# Patient Record
Sex: Female | Born: 1990 | Race: White | Hispanic: No | Marital: Single | Smoking: Never smoker
Health system: Southern US, Community
[De-identification: ages and names within clinical notes are randomized; demographics above are authoritative.]

## PROBLEM LIST (undated history)

## (undated) ENCOUNTER — Inpatient Hospital Stay (HOSPITAL_COMMUNITY): Payer: Self-pay

## (undated) DIAGNOSIS — E7212 Methylenetetrahydrofolate reductase deficiency: Secondary | ICD-10-CM

## (undated) DIAGNOSIS — M26609 Unspecified temporomandibular joint disorder, unspecified side: Secondary | ICD-10-CM

## (undated) DIAGNOSIS — Z8619 Personal history of other infectious and parasitic diseases: Secondary | ICD-10-CM

## (undated) DIAGNOSIS — E039 Hypothyroidism, unspecified: Secondary | ICD-10-CM

## (undated) DIAGNOSIS — Z973 Presence of spectacles and contact lenses: Secondary | ICD-10-CM

## (undated) DIAGNOSIS — K219 Gastro-esophageal reflux disease without esophagitis: Secondary | ICD-10-CM

## (undated) DIAGNOSIS — R519 Headache, unspecified: Secondary | ICD-10-CM

## (undated) DIAGNOSIS — Z87440 Personal history of urinary (tract) infections: Secondary | ICD-10-CM

## (undated) DIAGNOSIS — Z1589 Genetic susceptibility to other disease: Secondary | ICD-10-CM

## (undated) DIAGNOSIS — F419 Anxiety disorder, unspecified: Secondary | ICD-10-CM

## (undated) DIAGNOSIS — R51 Headache: Secondary | ICD-10-CM

## (undated) HISTORY — DX: Personal history of other infectious and parasitic diseases: Z86.19

## (undated) HISTORY — DX: Hypothyroidism, unspecified: E03.9

## (undated) HISTORY — DX: Personal history of urinary (tract) infections: Z87.440

## (undated) HISTORY — PX: NO PAST SURGERIES: SHX2092

## (undated) HISTORY — DX: Genetic susceptibility to other disease: Z15.89

## (undated) HISTORY — DX: Anxiety disorder, unspecified: F41.9

## (undated) HISTORY — DX: Headache: R51

## (undated) HISTORY — DX: Methylenetetrahydrofolate reductase deficiency: E72.12

## (undated) HISTORY — DX: Headache, unspecified: R51.9

---
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
## 2015-04-01 LAB — OB RESULTS CONSOLE GC/CHLAMYDIA
Chlamydia: NEGATIVE
GC PROBE AMP, GENITAL: NEGATIVE

## 2015-04-01 LAB — OB RESULTS CONSOLE ABO/RH: RH TYPE: POSITIVE

## 2015-04-01 LAB — OB RESULTS CONSOLE HIV ANTIBODY (ROUTINE TESTING): HIV: NONREACTIVE

## 2015-04-01 LAB — OB RESULTS CONSOLE RPR: RPR: NONREACTIVE

## 2015-04-01 LAB — OB RESULTS CONSOLE RUBELLA ANTIBODY, IGM: Rubella: IMMUNE

## 2015-04-01 LAB — OB RESULTS CONSOLE HEPATITIS B SURFACE ANTIGEN: HEP B S AG: NEGATIVE

## 2015-04-01 LAB — OB RESULTS CONSOLE ANTIBODY SCREEN: Antibody Screen: NEGATIVE

## 2015-04-28 NOTE — L&D Delivery Note (Signed)
Delivery Note  SVD viable female Apgars 8,9 over intact perineum.  Nuchal x 2 reduced.  Placenta delivered spontaneously intact with 3VC. Good support and hemostasis noted and R/V exam confirms.  PH art was sent.  [place] cord blood was not done.  Mother and baby were doing well.  EBL 200cc  Candice Campavid Kristine Chahal, MD

## 2015-09-21 LAB — OB RESULTS CONSOLE GBS: STREP GROUP B AG: POSITIVE

## 2015-09-28 ENCOUNTER — Encounter (HOSPITAL_COMMUNITY): Payer: Self-pay | Admitting: *Deleted

## 2015-09-28 ENCOUNTER — Inpatient Hospital Stay (HOSPITAL_COMMUNITY)
Admission: AD | Admit: 2015-09-28 | Discharge: 2015-09-29 | Disposition: A | Discharge status: Home / Self Care | Payer: BLUE CROSS/BLUE SHIELD | Source: Ambulatory Visit | Attending: Obstetrics and Gynecology | Admitting: Obstetrics and Gynecology

## 2015-09-28 DIAGNOSIS — Z3A37 37 weeks gestation of pregnancy: Secondary | ICD-10-CM | POA: Insufficient documentation

## 2015-09-28 DIAGNOSIS — R109 Unspecified abdominal pain: Secondary | ICD-10-CM | POA: Diagnosis present

## 2015-09-28 DIAGNOSIS — O26893 Other specified pregnancy related conditions, third trimester: Secondary | ICD-10-CM | POA: Diagnosis not present

## 2015-09-28 DIAGNOSIS — R030 Elevated blood-pressure reading, without diagnosis of hypertension: Secondary | ICD-10-CM | POA: Diagnosis not present

## 2015-09-28 DIAGNOSIS — O26899 Other specified pregnancy related conditions, unspecified trimester: Secondary | ICD-10-CM

## 2015-09-28 LAB — URINE MICROSCOPIC-ADD ON: RBC / HPF: NONE SEEN RBC/hpf (ref 0–5)

## 2015-09-28 LAB — URINALYSIS, ROUTINE W REFLEX MICROSCOPIC
Bilirubin Urine: NEGATIVE
GLUCOSE, UA: NEGATIVE mg/dL
Hgb urine dipstick: NEGATIVE
KETONES UR: NEGATIVE mg/dL
Nitrite: NEGATIVE
PH: 6 (ref 5.0–8.0)
Protein, ur: NEGATIVE mg/dL
Specific Gravity, Urine: <1.005 — ABNORMAL LOW (ref 1.005–1.030)

## 2015-09-28 LAB — CBC
HCT: 36.3 % (ref 36.0–46.0)
HEMOGLOBIN: 12.4 g/dL (ref 12.0–15.0)
MCH: 29.5 pg (ref 26.0–34.0)
MCHC: 34.2 g/dL (ref 30.0–36.0)
MCV: 86.4 fL (ref 78.0–100.0)
Platelets: 263 10*3/uL (ref 150–400)
RBC: 4.2 MIL/uL (ref 3.87–5.11)
RDW: 14.1 % (ref 11.5–15.5)
WBC: 12.4 10*3/uL — AB (ref 4.0–10.5)

## 2015-09-28 NOTE — Progress Notes (Signed)
Dr Ashok PallWouk notified of pt's admission and status. Lab orders received and RN to ck pt.

## 2015-09-28 NOTE — MAU Note (Addendum)
Having abd pain and pelvic pressure off and on for 3 wks. Irreg ctxs today but are different now. Some lightheadedness and nausea. No headaches. 3cm yesterday in office

## 2015-09-28 NOTE — MAU Note (Signed)
Pt states pain started in bil rib area initially and then moved down abd and now in lower abd and down legs. Pain is sharp in back and legs and cramping in abd

## 2015-09-29 DIAGNOSIS — R109 Unspecified abdominal pain: Secondary | ICD-10-CM

## 2015-09-29 DIAGNOSIS — O26893 Other specified pregnancy related conditions, third trimester: Secondary | ICD-10-CM | POA: Diagnosis not present

## 2015-09-29 DIAGNOSIS — O9989 Other specified diseases and conditions complicating pregnancy, childbirth and the puerperium: Secondary | ICD-10-CM

## 2015-09-29 LAB — COMPREHENSIVE METABOLIC PANEL
ALT: 14 U/L (ref 14–54)
ANION GAP: 7 (ref 5–15)
AST: 19 U/L (ref 15–41)
Albumin: 3.4 g/dL — ABNORMAL LOW (ref 3.5–5.0)
Alkaline Phosphatase: 151 U/L — ABNORMAL HIGH (ref 38–126)
BILIRUBIN TOTAL: 0.7 mg/dL (ref 0.3–1.2)
BUN: 7 mg/dL (ref 6–20)
CHLORIDE: 107 mmol/L (ref 101–111)
CO2: 21 mmol/L — ABNORMAL LOW (ref 22–32)
Calcium: 8.8 mg/dL — ABNORMAL LOW (ref 8.9–10.3)
Creatinine, Ser: 0.52 mg/dL (ref 0.44–1.00)
Glucose, Bld: 80 mg/dL (ref 65–99)
POTASSIUM: 3.8 mmol/L (ref 3.5–5.1)
Sodium: 135 mmol/L (ref 135–145)
TOTAL PROTEIN: 6.6 g/dL (ref 6.5–8.1)

## 2015-09-29 LAB — PROTEIN / CREATININE RATIO, URINE: CREATININE, URINE: 41 mg/dL

## 2015-09-29 NOTE — MAU Provider Note (Signed)
MAU HISTORY AND PHYSICAL  Chief Complaint:  Abdominal Pain   Andrea [name] is a 25 y.o.  G1P0 with IUP at [redacted]w[redacted]d presenting for Abdominal Pain  Has suffered from abdominal squeezing/pressure for weeks, prescribed opioids for that. Told to seek medical attention if pain changed. Old pain is upper anterior abdominal squeezing pain. Now having lower abdominal squeezing pain that radiates around to back and down legs. No weakness or numbness, no bowel or bladder dysfunction. Some contractions this morning, none since. No vaginal bleeding or LOF. No elevated BPs this pregnancy. Has baseline headache "all the time" unchanged today. No vision change. No fevers, no n/v, no change in appetite.   Past Medical History  Diagnosis Date  . Medical history non-contributory     Past Surgical History  Procedure Laterality Date  . No past surgeries      History reviewed. No pertinent family history.  Social History  Substance Use Topics  . Smoking status: Never Smoker   . Smokeless tobacco: None  . Alcohol Use: No    Allergies  Allergen Reactions  . Codeine Itching    Prescriptions prior to admission  Medication Sig Dispense Refill Last Dose  . Doxylamine-Pyridoxine (DICLEGIS PO) Take by mouth.   09/28/2015 at Unknown time  . Hydrocodone-Acetaminophen (VICODIN) 5-300 MG TABS Take by mouth.   09/28/2015 at 1500  . Prenatal Vit-Fe Fumarate-FA (PRENATAL MULTIVITAMIN) TABS tablet Take 1 tablet by mouth daily at 12 noon.   09/28/2015 at Unknown time    Review of Systems - Negative except for what is mentioned in HPI.  Physical Exam  Blood pressure 107/71, pulse 79, temperature 98.2 F (36.8 C), resp. rate 18, height  (1.575 m), weight 166 lb 12.8 oz (75.66 kg). GENERAL: Well-developed, well-nourished female in no acute distress.  LUNGS: Clear to auscultation bilaterally.  HEART: Regular rate and rhythm. ABDOMEN: Soft, nontender, nondistended, gravid.  EXTREMITIES: Nontender, no edema,  2+ distal pulses. Cervical Exam: 1/thick/high per rn exam FHT:  140/mod/+a/-d Contractions: irregular/infrequent   Labs: Results for orders placed or performed during the hospital encounter of 09/28/15 (from the past 24 hour(s))  Urinalysis, Routine w reflex microscopic (not at [facility])   Collection Time: 09/28/15 11:20 PM  Result Value Ref Range   Color, Urine YELLOW YELLOW   APPearance CLEAR CLEAR   Specific Gravity, Urine <1.005 (L) 1.005 - 1.030   pH 6.0 5.0 - 8.0   Glucose, UA NEGATIVE NEGATIVE mg/dL   Hgb urine dipstick NEGATIVE NEGATIVE   Bilirubin Urine NEGATIVE NEGATIVE   Ketones, ur NEGATIVE NEGATIVE mg/dL   Protein, ur NEGATIVE NEGATIVE mg/dL   Nitrite NEGATIVE NEGATIVE   Leukocytes, UA TRACE (A) NEGATIVE  Urine microscopic-add on   Collection Time: 09/28/15 11:20 PM  Result Value Ref Range   Squamous Epithelial / LPF 0-5 (A) NONE SEEN   WBC, UA 0-5 0 - 5 WBC/hpf   RBC / HPF NONE SEEN 0 - 5 RBC/hpf   Bacteria, UA FEW (A) NONE SEEN  Protein / creatinine ratio, urine   Collection Time: 09/28/15 11:20 PM  Result Value Ref Range   Creatinine, Urine 41.00 mg/dL   Total Protein, Urine <6 mg/dL   Protein Creatinine Ratio        0.00 - 0.15 mg/mg[Cre]  CBC   Collection Time: 09/28/15 11:51 PM  Result Value Ref Range   WBC 12.4 (H) 4.0 - 10.5 K/uL   RBC 4.20 3.87 - 5.11 MIL/uL   Hemoglobin 12.4 12.0 - 15.0  g/dL   HCT 16.136.3 09.636.0 - 04.546.0 %   MCV 86.4 78.0 - 100.0 fL   MCH 29.5 26.0 - 34.0 pg   MCHC 34.2 30.0 - 36.0 g/dL   RDW 40.914.1 81.111.5 - 91.415.5 %   Platelets 263 150 - 400 K/uL  Comprehensive metabolic panel   Collection Time: 09/28/15 11:51 PM  Result Value Ref Range   Sodium 135 135 - 145 mmol/L   Potassium 3.8 3.5 - 5.1 mmol/L   Chloride 107 101 - 111 mmol/L   CO2 21 (L) 22 - 32 mmol/L   Glucose, Bld 80 65 - 99 mg/dL   BUN 7 6 - 20 mg/dL   Creatinine, Ser 7.820.52 0.44 - 1.00 mg/dL   Calcium 8.8 (L) 8.9 - 10.3 mg/dL   Total Protein 6.6 6.5 - 8.1 g/dL   Albumin 3.4 (L)  3.5 - 5.0 g/dL   AST 19 15 - 41 U/L   ALT 14 14 - 54 U/L   Alkaline Phosphatase 151 (H) 38 - 126 U/L   Total Bilirubin 0.7 0.3 - 1.2 mg/dL   GFR calc non Af Amer >60 >60 mL/min   GFR calc Af Amer >60 >60 mL/min   Anion gap 7 5 - 15    Imaging Studies:  No results found.  Assessment: Andrea [name] is  25 y.o. G1P0 at 296w5d presents with Abdominal Pain Etiology unclear, unlikely serious. NST reactive, no vaginal bleeding, no uterine tenderness - do not think abruption. No clear uti symptoms, afebrile, and urinalysis not suggestive of infection. No regular contractions, cervix 1 cm dilated - do not think labor. No lab abnormalities or ruq ttp to suggest biliary or gallbladder or hepatic involvement. Pain is not particularly right-sided, appetite unchange, afebrile, no significant white count - do not think appendicitis. Initial BPs mildly elevated, but subsequents well wnl and labs unremarkable, with chronic headache as only symptom - do not think this is preeclampsia or gestational htn. Discussed w/ Dr. Rana SnareLowe.  Plan: - d/c home with abruption, ptl, pprom, preeclampsia, and abdominal pain return precautions - ob f/u this week as scheduled, bp check then  Silvano Bilisoah B Ronique Simerly [id number]/201712:54 AM

## 2015-09-29 NOTE — Progress Notes (Signed)
Dr Wouk in earlier to discuss d/c plan. Written and verbal d/c instructions given and understanding voiced 

## 2015-09-29 NOTE — Discharge Instructions (Signed)

## 2015-09-29 NOTE — Progress Notes (Signed)
OK to d/c efm per Dr Ashok PallWouk. Dr Ashok PallWouk in to see pt

## 2015-09-30 ENCOUNTER — Encounter (HOSPITAL_COMMUNITY): Payer: Self-pay | Admitting: *Deleted

## 2015-09-30 ENCOUNTER — Inpatient Hospital Stay (HOSPITAL_COMMUNITY)
Admission: AD | Admit: 2015-09-30 | Discharge: 2015-09-30 | Disposition: A | Discharge status: Home / Self Care | Payer: BLUE CROSS/BLUE SHIELD | Source: Ambulatory Visit | Attending: Obstetrics and Gynecology | Admitting: Obstetrics and Gynecology

## 2015-09-30 DIAGNOSIS — R109 Unspecified abdominal pain: Secondary | ICD-10-CM | POA: Diagnosis not present

## 2015-09-30 DIAGNOSIS — R42 Dizziness and giddiness: Secondary | ICD-10-CM | POA: Insufficient documentation

## 2015-09-30 DIAGNOSIS — O26893 Other specified pregnancy related conditions, third trimester: Secondary | ICD-10-CM

## 2015-09-30 DIAGNOSIS — O368130 Decreased fetal movements, third trimester, not applicable or unspecified: Secondary | ICD-10-CM | POA: Insufficient documentation

## 2015-09-30 DIAGNOSIS — Z3A37 37 weeks gestation of pregnancy: Secondary | ICD-10-CM | POA: Diagnosis not present

## 2015-09-30 DIAGNOSIS — R51 Headache: Secondary | ICD-10-CM

## 2015-09-30 DIAGNOSIS — O9989 Other specified diseases and conditions complicating pregnancy, childbirth and the puerperium: Secondary | ICD-10-CM | POA: Diagnosis not present

## 2015-09-30 LAB — CBC WITH DIFFERENTIAL/PLATELET
BASOS ABS: 0 10*3/uL (ref 0.0–0.1)
BASOS PCT: 0 %
EOS ABS: 0 10*3/uL (ref 0.0–0.7)
EOS PCT: 0 %
HCT: 35.8 % — ABNORMAL LOW (ref 36.0–46.0)
Hemoglobin: 12.2 g/dL (ref 12.0–15.0)
LYMPHS PCT: 22 %
Lymphs Abs: 2.7 10*3/uL (ref 0.7–4.0)
MCH: 29.2 pg (ref 26.0–34.0)
MCHC: 34.1 g/dL (ref 30.0–36.0)
MCV: 85.6 fL (ref 78.0–100.0)
MONO ABS: 0.5 10*3/uL (ref 0.1–1.0)
Monocytes Relative: 4 %
Neutro Abs: 8.8 10*3/uL — ABNORMAL HIGH (ref 1.7–7.7)
Neutrophils Relative %: 74 %
PLATELETS: 253 10*3/uL (ref 150–400)
RBC: 4.18 MIL/uL (ref 3.87–5.11)
RDW: 14 % (ref 11.5–15.5)
WBC: 12 10*3/uL — AB (ref 4.0–10.5)

## 2015-09-30 LAB — URINALYSIS, ROUTINE W REFLEX MICROSCOPIC
Bilirubin Urine: NEGATIVE
GLUCOSE, UA: NEGATIVE mg/dL
LEUKOCYTES UA: NEGATIVE
Nitrite: NEGATIVE
PROTEIN: NEGATIVE mg/dL
Specific Gravity, Urine: 1.015 (ref 1.005–1.030)
pH: 6 (ref 5.0–8.0)

## 2015-09-30 LAB — URINE MICROSCOPIC-ADD ON

## 2015-09-30 LAB — LACTATE DEHYDROGENASE: LDH: 138 U/L (ref 98–192)

## 2015-09-30 LAB — COMPREHENSIVE METABOLIC PANEL
ALT: 14 U/L (ref 14–54)
AST: 16 U/L (ref 15–41)
Albumin: 3.5 g/dL (ref 3.5–5.0)
Alkaline Phosphatase: 147 U/L — ABNORMAL HIGH (ref 38–126)
Anion gap: 8 (ref 5–15)
BUN: 6 mg/dL (ref 6–20)
CHLORIDE: 105 mmol/L (ref 101–111)
CO2: 21 mmol/L — AB (ref 22–32)
CREATININE: 0.51 mg/dL (ref 0.44–1.00)
Calcium: 9.1 mg/dL (ref 8.9–10.3)
GFR calc Af Amer: >60 mL/min (ref >60)
Glucose, Bld: 91 mg/dL (ref 65–99)
POTASSIUM: 3.6 mmol/L (ref 3.5–5.1)
SODIUM: 134 mmol/L — AB (ref 135–145)
Total Bilirubin: 0.7 mg/dL (ref 0.3–1.2)
Total Protein: 6.9 g/dL (ref 6.5–8.1)

## 2015-09-30 LAB — PROTEIN / CREATININE RATIO, URINE
CREATININE, URINE: 151 mg/dL
Protein Creatinine Ratio: 0.09 mg/mg{Cre} (ref 0.00–0.15)
TOTAL PROTEIN, URINE: 13 mg/dL

## 2015-09-30 LAB — URIC ACID: URIC ACID, SERUM: 5.4 mg/dL (ref 2.3–6.6)

## 2015-09-30 NOTE — MAU Note (Signed)
Urine sent to lab 

## 2015-09-30 NOTE — MAU Note (Addendum)
Sent from office, pre-eclampsia eval.  BP elevated.  Has headache since last night.  Tried Tylenol and Vicodin, no relief.  Feels dizzy. Face feels puffy. Denies visual changes or epigastric pain

## 2015-09-30 NOTE — Discharge Instructions (Signed)
Braxton Hicks Contractions °Contractions of the uterus can occur throughout pregnancy. Contractions are not always a sign that you are in labor.  °WHAT ARE BRAXTON HICKS CONTRACTIONS?  °Contractions that occur before labor are called Braxton Hicks contractions, or false labor. Toward the end of pregnancy (32-34 weeks), these contractions can develop more often and may become more forceful. This is not true labor because these contractions do not result in opening (dilatation) and thinning of the cervix. They are sometimes difficult to tell apart from true labor because these contractions can be forceful and people have different pain tolerances. You should not feel embarrassed if you go to the hospital with false labor. Sometimes, the only way to tell if you are in true labor is for your health care provider to look for changes in the cervix. °If there are no prenatal problems or other health problems associated with the pregnancy, it is completely safe to be sent home with false labor and await the onset of true labor. °HOW CAN YOU TELL THE DIFFERENCE BETWEEN TRUE AND FALSE LABOR? °False Labor °· The contractions of false labor are usually shorter and not as hard as those of true labor.   °· The contractions are usually irregular.   °· The contractions are often felt in the front of the lower abdomen and in the groin.   °· The contractions may go away when you walk around or change positions while lying down.   °· The contractions get weaker and are shorter lasting as time goes on.   °· The contractions do not usually become progressively stronger, regular, and closer together as with true labor.   °True Labor °· Contractions in true labor last 30-70 seconds, become very regular, usually become more intense, and increase in frequency.   °· The contractions do not go away with walking.   °· The discomfort is usually felt in the top of the uterus and spreads to the lower abdomen and low back.   °· True labor can be  determined by your health care provider with an exam. This will show that the cervix is dilating and getting thinner.   °WHAT TO REMEMBER °· Keep up with your usual exercises and follow other instructions given by your health care provider.   °· Take medicines as directed by your health care provider.   °· Keep your regular prenatal appointments.   °· Eat and drink lightly if you think you are going into labor.   °· If Braxton Hicks contractions are making you uncomfortable:   °¨ Change your position from lying down or resting to walking, or from walking to resting.   °¨ Sit and rest in a tub of warm water.   °¨ Drink 2-3 glasses of water. Dehydration may cause these contractions.   °¨ Do slow and deep breathing several times an hour.   °WHEN SHOULD I SEEK IMMEDIATE MEDICAL CARE? °Seek immediate medical care if: °· Your contractions become stronger, more regular, and closer together.   °· You have fluid leaking or gushing from your vagina.   °· You have a fever.   °· You pass blood-tinged mucus.   °· You have vaginal bleeding.   °· You have continuous abdominal pain.   °· You have low back pain that you never had before.   °· You feel your baby's head pushing down and causing pelvic pressure.   °· Your baby is not moving as much as it used to.   °  °This information is not intended to replace advice given to you by your health care provider. Make sure you discuss any questions you have with your health care   provider.   Document Released: 04/13/2005 Document Revised: 04/18/2013 Document Reviewed: 01/23/2013 Elsevier Interactive Patient Education 2016 [employer] Hypertension During Pregnancy Hypertension is also called high blood pressure. Blood pressure moves blood in your body. Sometimes, the force that moves the blood becomes too strong. When you are pregnant, this condition should be watched carefully. It can cause problems for you and your baby. HOME CARE   Make and keep all of your doctor  visits.  Take medicine as told by your doctor. Tell your doctor about all medicines you take.  Eat very little salt.  Exercise regularly.  Do not drink alcohol.  Do not smoke.  Do not have drinks with caffeine.  Lie on your left side when resting.  Your health care provider may ask you to take one low-dose aspirin (81mg ) each day. GET HELP RIGHT AWAY IF:  You have bad belly (abdominal) pain.  You have sudden puffiness (swelling) in the hands, ankles, or face.  You gain 4 pounds (1.8 kilograms) or more in 1 week.  You throw up (vomit) repeatedly.  You have bleeding from the vagina.  You do not feel the baby moving as much.  You have a headache.  You have blurred or double vision.  You have muscle twitching or spasms.  You have shortness of breath.  You have blue fingernails and lips.  You have blood in your pee (urine). MAKE SURE YOU:  Understand these instructions.  Will watch your condition.  Will get help right away if you are not doing well or get worse.   This information is not intended to replace advice given to you by your health care provider. Make sure you discuss any questions you have with your health care provider.   Document Released: 05/16/2010 Document Revised: 05/04/2014 Document Reviewed: 11/10/2012 Elsevier Interactive Patient Education [employer] Inc[employer]

## 2015-09-30 NOTE — MAU Note (Signed)
Baby hasn't moved as much today

## 2015-09-30 NOTE — MAU Provider Note (Signed)
History     CSN: [id number]  Arrival date and time: 09/30/15 1810   None     Chief Complaint  Patient presents with  . Hypertension  . Headache   HPI Andrea [name] is 25 y.o. G1P0 5281w6d weeks presenting  pre eclampsia workup.  Patient of Dr. Marcelle OverlieHolland.   + for dizziness and headache that began last night. Mild swelling in her feet.  Neg for visual changes and epigastric pain. Called the office this am.  Heard back from them this afternoon and instructed to come in.  Negative for vaginal bleeding.  Denies contractions but has pelvic pain "all the time".  Decreased fetal movement today.   Was seen 6/4 with abdominal pain, pressure over the past few weeks.  Had Reactive NST and normal labs at that visit. Patient states she has had some elevated blood pressures.    Past Medical History  Diagnosis Date  . Medical history non-contributory     Past Surgical History  Procedure Laterality Date  . No past surgeries      History reviewed. No pertinent family history.  Social History  Substance Use Topics  . Smoking status: Never Smoker   . Smokeless tobacco: None  . Alcohol Use: No    Allergies:  Allergies  Allergen Reactions  . Codeine Itching    Prescriptions prior to admission  Medication Sig Dispense Refill Last Dose  . Doxylamine-Pyridoxine (DICLEGIS PO) Take by mouth.   09/28/2015 at Unknown time  . Hydrocodone-Acetaminophen (VICODIN) 5-300 MG TABS Take by mouth.   09/28/2015 at 1500  . Prenatal Vit-Fe Fumarate-FA (PRENATAL MULTIVITAMIN) TABS tablet Take 1 tablet by mouth daily at 12 noon.   09/28/2015 at Unknown time    Review of Systems  Constitutional: Negative for fever and chills.  Eyes: Negative for blurred vision and double vision.  Gastrointestinal: Positive for abdominal pain.  Genitourinary:       Neg for vaginal bleeding, leaking of fluid Less fetal movement today.   Neurological: Positive for dizziness and headaches.   Physical Exam   Blood pressure  151/96, pulse 94, temperature 98.1 F (36.7 C), resp. rate 20.  Physical Exam  Constitutional: She is oriented to person, place, and time. She appears well-developed and well-nourished. No distress.  HENT:  Head: Normocephalic.  Neck: Normal range of motion.  Cardiovascular: Normal rate.   BP elevation  Respiratory: Effort normal.  Genitourinary:  Cervical exam by Trinna PostAlex, RN------1.5 cm dilated.  50%, -3 Vertex and soft, posterior cervix.   Musculoskeletal:  Neg for clonus  Neurological: She is alert and oriented to person, place, and time.  Skin: Skin is warm and dry.  Psychiatric: She has a normal mood and affect. Her behavior is normal. Thought content normal.    Results for orders placed or performed during the hospital encounter of 09/30/15 (from the past 24 hour(s))  Protein / creatinine ratio, urine     Status: None   Collection Time: 09/30/15  6:35 PM  Result Value Ref Range   Creatinine, Urine 151.00 mg/dL   Total Protein, Urine 13 mg/dL   Protein Creatinine Ratio 0.09 0.00 - 0.15 mg/mg[Cre]  Urinalysis, Routine w reflex microscopic ([facility])     Status: Abnormal   Collection Time: 09/30/15  6:35 PM  Result Value Ref Range   Color, Urine YELLOW YELLOW   APPearance CLEAR CLEAR   Specific Gravity, Urine 1.015 1.005 - 1.030   pH 6.0 5.0 - 8.0   Glucose, UA NEGATIVE  NEGATIVE mg/dL   Hgb urine dipstick MODERATE (A) NEGATIVE   Bilirubin Urine NEGATIVE NEGATIVE   Ketones, ur >80 (A) NEGATIVE mg/dL   Protein, ur NEGATIVE NEGATIVE mg/dL   Nitrite NEGATIVE NEGATIVE   Leukocytes, UA NEGATIVE NEGATIVE  Urine microscopic-add on     Status: Abnormal   Collection Time: 09/30/15  6:35 PM  Result Value Ref Range   Squamous Epithelial / LPF 0-5 (A) NONE SEEN   WBC, UA 0-5 0 - 5 WBC/hpf   RBC / HPF 6-30 0 - 5 RBC/hpf   Bacteria, UA FEW (A) NONE SEEN   Urine-Other MUCOUS PRESENT   CBC with Differential/Platelet     Status: Abnormal   Collection Time: 09/30/15  7:38 PM  Result  Value Ref Range   WBC 12.0 (H) 4.0 - 10.5 K/uL   RBC 4.18 3.87 - 5.11 MIL/uL   Hemoglobin 12.2 12.0 - 15.0 g/dL   HCT 16.1 (L) 09.6 - 04.5 %   MCV 85.6 78.0 - 100.0 fL   MCH 29.2 26.0 - 34.0 pg   MCHC 34.1 30.0 - 36.0 g/dL   RDW 40.9 81.1 - 91.4 %   Platelets 253 150 - 400 K/uL   Neutrophils Relative % 74 %   Neutro Abs 8.8 (H) 1.7 - 7.7 K/uL   Lymphocytes Relative 22 %   Lymphs Abs 2.7 0.7 - 4.0 K/uL   Monocytes Relative 4 %   Monocytes Absolute 0.5 0.1 - 1.0 K/uL   Eosinophils Relative 0 %   Eosinophils Absolute 0.0 0.0 - 0.7 K/uL   Basophils Relative 0 %   Basophils Absolute 0.0 0.0 - 0.1 K/uL  Comprehensive metabolic panel     Status: Abnormal   Collection Time: 09/30/15  7:38 PM  Result Value Ref Range   Sodium 134 (L) 135 - 145 mmol/L   Potassium 3.6 3.5 - 5.1 mmol/L   Chloride 105 101 - 111 mmol/L   CO2 21 (L) 22 - 32 mmol/L   Glucose, Bld 91 65 - 99 mg/dL   BUN 6 6 - 20 mg/dL   Creatinine, Ser 7.82 0.44 - 1.00 mg/dL   Calcium 9.1 8.9 - 95.6 mg/dL   Total Protein 6.9 6.5 - 8.1 g/dL   Albumin 3.5 3.5 - 5.0 g/dL   AST 16 15 - 41 U/L   ALT 14 14 - 54 U/L   Alkaline Phosphatase 147 (H) 38 - 126 U/L   Total Bilirubin 0.7 0.3 - 1.2 mg/dL   GFR calc non Af Amer >60 >60 mL/min   GFR calc Af Amer >60 >60 mL/min   Anion gap 8 5 - 15  Lactate dehydrogenase     Status: None   Collection Time: 09/30/15  7:38 PM  Result Value Ref Range   LDH 138 98 - 192 U/L  Uric acid     Status: None   Collection Time: 09/30/15  7:38 PM  Result Value Ref Range   Uric Acid, Serum 5.4 2.3 - 6.6 mg/dL    Filed Vitals:   21/30/86 1916 09/30/15 1931 09/30/15 1946 09/30/15 2001  BP: 126/80 142/97 131/85 116/78  Pulse: 80 99 87 82  Temp:      Resp:       MAU Course  Procedures  MDM MSE Labs Exam NST--reactive.  Baseline 140, mod variability.  Occasional irreg contraction.   19:43  Spoke with Dr. Marcelle Overlie, reported BP reading and MSE.  PIH labs pending.  NST in progress.  To call  with lab and NST results.  20:50  Reported labs, NST results and cervical exam to Dr. Marcelle Overlie.  Order given for her to call office tomorrow to be seen.   Assessment and Plan  A:  Dizziness and headache in third trimester pregnancy      Pre-eclampsia ruled out       P: Reviewed labs, NSt with Patient     Instructed to call office in AM to make appt with Dr. Marcelle Overlie.      Gestational hypertension and eclampsia information given to patient.  Jeannine Pennisi,EVE M 09/30/2015, 7:26 PM

## 2015-10-02 ENCOUNTER — Telehealth (HOSPITAL_COMMUNITY): Payer: Self-pay | Admitting: *Deleted

## 2015-10-02 ENCOUNTER — Encounter (HOSPITAL_COMMUNITY): Payer: Self-pay | Admitting: *Deleted

## 2015-10-02 NOTE — Telephone Encounter (Signed)
Preadmission screen  

## 2015-10-04 ENCOUNTER — Inpatient Hospital Stay (HOSPITAL_COMMUNITY)
Admission: AD | Admit: 2015-10-04 | Discharge: 2015-10-04 | Disposition: A | Discharge status: Home / Self Care | Payer: BLUE CROSS/BLUE SHIELD | Source: Ambulatory Visit | Attending: Obstetrics and Gynecology | Admitting: Obstetrics and Gynecology

## 2015-10-04 ENCOUNTER — Encounter (HOSPITAL_COMMUNITY): Payer: Self-pay | Admitting: *Deleted

## 2015-10-04 DIAGNOSIS — G43001 Migraine without aura, not intractable, with status migrainosus: Secondary | ICD-10-CM

## 2015-10-04 DIAGNOSIS — O99353 Diseases of the nervous system complicating pregnancy, third trimester: Secondary | ICD-10-CM | POA: Insufficient documentation

## 2015-10-04 DIAGNOSIS — Z3A38 38 weeks gestation of pregnancy: Secondary | ICD-10-CM | POA: Insufficient documentation

## 2015-10-04 DIAGNOSIS — G43009 Migraine without aura, not intractable, without status migrainosus: Secondary | ICD-10-CM | POA: Insufficient documentation

## 2015-10-04 DIAGNOSIS — R42 Dizziness and giddiness: Secondary | ICD-10-CM | POA: Diagnosis present

## 2015-10-04 LAB — COMPREHENSIVE METABOLIC PANEL
ALT: 13 U/L — ABNORMAL LOW (ref 14–54)
AST: 17 U/L (ref 15–41)
Albumin: 3.1 g/dL — ABNORMAL LOW (ref 3.5–5.0)
Alkaline Phosphatase: 146 U/L — ABNORMAL HIGH (ref 38–126)
Anion gap: 7 (ref 5–15)
BUN: 7 mg/dL (ref 6–20)
CHLORIDE: 108 mmol/L (ref 101–111)
CO2: 20 mmol/L — ABNORMAL LOW (ref 22–32)
Calcium: 8.5 mg/dL — ABNORMAL LOW (ref 8.9–10.3)
Creatinine, Ser: 0.62 mg/dL (ref 0.44–1.00)
Glucose, Bld: 104 mg/dL — ABNORMAL HIGH (ref 65–99)
POTASSIUM: 3.3 mmol/L — AB (ref 3.5–5.1)
Sodium: 135 mmol/L (ref 135–145)
Total Bilirubin: 0.7 mg/dL (ref 0.3–1.2)
Total Protein: 6.5 g/dL (ref 6.5–8.1)

## 2015-10-04 LAB — CBC
HEMATOCRIT: 35.5 % — AB (ref 36.0–46.0)
Hemoglobin: 12 g/dL (ref 12.0–15.0)
MCH: 29.2 pg (ref 26.0–34.0)
MCHC: 33.8 g/dL (ref 30.0–36.0)
MCV: 86.4 fL (ref 78.0–100.0)
PLATELETS: 241 10*3/uL (ref 150–400)
RBC: 4.11 MIL/uL (ref 3.87–5.11)
RDW: 14.3 % (ref 11.5–15.5)
WBC: 10.4 10*3/uL (ref 4.0–10.5)

## 2015-10-04 MED ORDER — DIPHENHYDRAMINE HCL 50 MG/ML IJ SOLN
12.5000 mg | Freq: Once | INTRAMUSCULAR | Status: AC
Start: 2015-10-04 — End: 2015-10-04
  Administered 2015-10-04: 12.5 mg via INTRAVENOUS
  Filled 2015-10-04: qty 1

## 2015-10-04 MED ORDER — SODIUM CHLORIDE 0.9 % IV SOLN: 12.5000 mg | Freq: Once | INTRAVENOUS | Status: DC

## 2015-10-04 MED ORDER — OXYCODONE-ACETAMINOPHEN 5-325 MG PO TABS: 1.0000 | ORAL_TABLET | ORAL | Status: DC | PRN

## 2015-10-04 MED ORDER — CYCLOBENZAPRINE HCL 10 MG PO TABS: 10.0000 mg | ORAL_TABLET | Freq: Three times a day (TID) | ORAL | Status: AC | PRN

## 2015-10-04 MED ORDER — LACTATED RINGERS IV BOLUS (SEPSIS)
1000.0000 mL | Freq: Once | INTRAVENOUS | Status: AC
  Administered 2015-10-04: 1000 mL via INTRAVENOUS

## 2015-10-04 MED ORDER — BUTALBITAL-APAP-CAFFEINE 50-325-40 MG PO TABS
2.0000 | ORAL_TABLET | Freq: Once | ORAL | Status: AC
  Administered 2015-10-04: 2 via ORAL
  Filled 2015-10-04: qty 2

## 2015-10-04 MED ORDER — CYCLOBENZAPRINE HCL 5 MG PO TABS
5.0000 mg | ORAL_TABLET | Freq: Once | ORAL | Status: AC
  Administered 2015-10-04: 5 mg via ORAL
  Filled 2015-10-04: qty 1

## 2015-10-04 NOTE — Discharge Instructions (Signed)
You were seen for a headache associated with dizziness. We think this was likely a prolonged migraine. You were given several medications with improved your symptoms.   Today/Tonight please do the following: 1) drink 32 oz or 1L of water every 2 hours will awake 2) Take the following medications 30 minutes before you want to go to bed -Percocet 1 tab -Benadryl 25 mg -Flexeril 10mg   Return to the MAU if you HA resumes.   Migraine Headache A migraine headache is an intense, throbbing pain on one or both sides of your head. A migraine can last for 30 minutes to several hours. CAUSES  The exact cause of a migraine headache is not always known. However, a migraine may be caused when nerves in the brain become irritated and release chemicals that cause inflammation. This causes pain. Certain things may also trigger migraines, such as:  Alcohol.  Smoking.  Stress.  Menstruation.  Aged cheeses.  Foods or drinks that contain nitrates, glutamate, aspartame, or tyramine.  Lack of sleep.  Chocolate.  Caffeine.  Hunger.  Physical exertion.  Fatigue.  Medicines used to treat chest pain (nitroglycerine), birth control pills, estrogen, and some blood pressure medicines. SIGNS AND SYMPTOMS  Pain on one or both sides of your head.  Pulsating or throbbing pain.  Severe pain that prevents daily activities.  Pain that is aggravated by any physical activity.  Nausea, vomiting, or both.  Dizziness.  Pain with exposure to bright lights, loud noises, or activity.  General sensitivity to bright lights, loud noises, or smells. Before you get a migraine, you may get warning signs that a migraine is coming (aura). An aura may include:  Seeing flashing lights.  Seeing bright spots, halos, or zigzag lines.  Having tunnel vision or blurred vision.  Having feelings of numbness or tingling.  Having trouble talking.  Having muscle weakness. DIAGNOSIS  A migraine headache is  often diagnosed based on:  Symptoms.  Physical exam.  A CT scan or MRI of your head. These imaging tests cannot diagnose migraines, but they can help rule out other causes of headaches. TREATMENT Medicines may be given for pain and nausea. Medicines can also be given to help prevent recurrent migraines.  HOME CARE INSTRUCTIONS  Only take over-the-counter or prescription medicines for pain or discomfort as directed by your health care provider. The use of long-term narcotics is not recommended.  Lie down in a dark, quiet room when you have a migraine.  Keep a journal to find out what may trigger your migraine headaches. For example, write down:  What you eat and drink.  How much sleep you get.  Any change to your diet or medicines.  Limit alcohol consumption.  Quit smoking if you smoke.  Get 7-9 hours of sleep, or as recommended by your health care provider.  Limit stress.  Keep lights dim if bright lights bother you and make your migraines worse. SEEK IMMEDIATE MEDICAL CARE IF:   Your migraine becomes severe.  You have a fever.  You have a stiff neck.  You have vision loss.  You have muscular weakness or loss of muscle control.  You start losing your balance or have trouble walking.  You feel faint or pass out.  You have severe symptoms that are different from your first symptoms. MAKE SURE YOU:   Understand these instructions.  Will watch your condition.  Will get help right away if you are not doing well or get worse.   This information is  not intended to replace advice given to you by your health care provider. Make sure you discuss any questions you have with your health care provider.   Document Released: 04/13/2005 Document Revised: 05/04/2014 Document Reviewed: 12/19/2012 Elsevier Interactive Patient Education [employer].

## 2015-10-04 NOTE — MAU Note (Addendum)
Patient presents at 1638 weeks gestation stating she was sent by her OB for IV fluids due to headaches. States she has had the current headache since last Sunday. Fetus active. Denies discharge but states she is spotting following the VE performed by her OB in the office today.

## 2015-10-04 NOTE — MAU Provider Note (Signed)
History     CSN: [id number] Arrival date and time: 10/04/15 1217 First Provider Initiated Contact with Patient 10/04/15 1320    Chief Complaint  Patient presents with  . "Sent by office for IV fluids"    HPI Patient is 25 y.o. G1P0 7527w3d here with complaints of dizziness, lightheadedness with HA. Reports sx for the past week without improvement. Denies focal weakness. Reports trying fioricet which helped decrease HA but still had other sx.  Describes the dizziness of feeling like the room is shaking but not spinning. Reports nausea due to this shaky feeling. Feels unwell enough that she is not driving.  Denies history of migraines. Denies ringing in ears  +FM, denies LOF, VB, contractions, vaginal discharge.   OB History    Gravida Para Term Preterm AB TAB SAB Ectopic Multiple Living   1               Past Medical History  Diagnosis Date  . Hx of varicella   . Headache   . Anxiety   . Hypothyroidism   . History of cystitis   . History of mononucleosis   . MTHFR mutation (HCC)   . Hypertension     Past Surgical History  Procedure Laterality Date  . No past surgeries      Family History  Problem Relation Age of Onset  . Asthma Neg Hx   . Arthritis Neg Hx   . Alcohol abuse Neg Hx   . Birth defects Neg Hx   . Cancer Neg Hx   . COPD Neg Hx   . Depression Neg Hx   . Diabetes Neg Hx   . Drug abuse Neg Hx   . Early death Neg Hx   . Hearing loss Neg Hx   . Heart disease Neg Hx   . Hyperlipidemia Neg Hx   . Hypertension Neg Hx   . Kidney disease Neg Hx   . Learning disabilities Neg Hx   . Mental illness Neg Hx   . Mental retardation Neg Hx   . Miscarriages / Stillbirths Neg Hx   . Stroke Neg Hx   . Vision loss Neg Hx   . Varicose Veins Neg Hx     Social History  Substance Use Topics  . Smoking status: Never Smoker   . Smokeless tobacco: Never Used  . Alcohol Use: No    Allergies:  Allergies  Allergen Reactions  . Codeine Itching    Prescriptions  prior to admission  Medication Sig Dispense Refill Last Dose  . butalbital-acetaminophen-caffeine (FIORICET, ESGIC) 50-325-40 MG tablet Take 1-2 tablets by mouth every 6 (six) hours as needed. For headache  0 10/03/2015 at Unknown time  . Doxylamine-Pyridoxine (DICLEGIS PO) Take 1-2 tablets by mouth 3 (three) times daily as needed (nausea).    10/03/2015 at Unknown time  . Hydrocodone-Acetaminophen (VICODIN) 5-300 MG TABS Take 1 tablet by mouth every 4 (four) hours as needed (pain).    10/03/2015 at Unknown time  . hydrOXYzine (ATARAX/VISTARIL) 25 MG tablet Take 25 mg by mouth every 6 (six) hours as needed for itching.   1 Past Month at Unknown time  . levalbuterol (XOPENEX HFA) 45 MCG/ACT inhaler Inhale 1 puff into the lungs daily as needed for wheezing or shortness of breath.    Past Month at Unknown time  . NON FORMULARY Take 35 mcg by mouth daily. Liothyron 35 mcg   10/03/2015 at Unknown time  . ondansetron (ZOFRAN-ODT) 4 MG disintegrating tablet Take 4 mg  by mouth every 8 (eight) hours as needed for nausea or vomiting.   Past Week at Unknown time  . Prenatal Vit-Fe Fumarate-FA (PRENATAL MULTIVITAMIN) TABS tablet Take 1 tablet by mouth daily at 12 noon.   10/03/2015 at Unknown time  . ranitidine (ZANTAC) 75 MG tablet Take 75 mg by mouth 2 (two) times daily as needed for heartburn.   10/03/2015 at Unknown time  . acetaminophen (TYLENOL) 500 MG tablet Take 1,000 mg by mouth every 6 (six) hours as needed for moderate pain.   prn    Review of Systems  Constitutional: Negative for fever and chills.  Eyes: Negative for blurred vision and double vision.  Respiratory: Negative for cough and shortness of breath.   Cardiovascular: Negative for chest pain and orthopnea.  Gastrointestinal: Negative for nausea and vomiting.  Genitourinary: Negative for dysuria, frequency and flank pain.  Musculoskeletal: Negative for myalgias.  Skin: Negative for rash.  Neurological: Negative for dizziness, tingling, weakness and  headaches.  Endo/Heme/Allergies: Does not bruise/bleed easily.  Psychiatric/Behavioral: Negative for depression and suicidal ideas. The patient is not nervous/anxious.    Physical Exam   Blood pressure 122/77, pulse 77, temperature 98.5 F (36.9 C), temperature source Oral, resp. rate 16, height  (1.575 m), weight 166 lb 12 oz (75.637 kg).  Physical Exam  Nursing note and vitals reviewed. Constitutional: She is oriented to person, place, and time. She appears well-developed and well-nourished. No distress.  Pregnant female  HENT:  Head: Normocephalic and atraumatic.  Eyes: Conjunctivae are normal. No scleral icterus.  Neck: Normal range of motion. Neck supple.  Cardiovascular: Normal rate and intact distal pulses.   Respiratory: Effort normal. No respiratory distress. She has no wheezes. She has no rales. She exhibits no tenderness.  GI: Soft. There is no tenderness. There is no rebound and no guarding.  Gravid  Genitourinary: Vagina normal.  Musculoskeletal: Normal range of motion. She exhibits no edema.  Neurological: She is alert and oriented to person, place, and time. She has normal strength. No cranial nerve deficit or sensory deficit. Coordination normal. GCS eye subscore is 4. GCS verbal subscore is 5. GCS motor subscore is 6.  Reflex Scores:      Bicep reflexes are 2+ on the right side and 2+ on the left side. Non-focal exam.  No nystagmus with dix halpike. Attempted Epley's maneuver on right and left with no improvement.   Skin: Skin is warm and dry. No rash noted.  Psychiatric: She has a normal mood and affect.    MAU Course  Procedures  MDM  4:05 PM Patient is s/p 2 L fluid with migraine cocktail (benadryl, fioricetx2 tabs, flexeril). Assessed and patient is improved. Feels a dull ache where the HA used to be but not dizzy.   Assessment and Plan  Andrea [name] is a 25 y.o. G1P0 at [redacted]w[redacted]d presenting with HA, Dizziness  #Migraine without aura, intractable:   HA/Dizziness ddx includes BBPV, migraine- intractable. Unlikely intracerebral pathology given benign neurological exam. Unlikely labrynthitis given lack of hearing abnormalities.  - Improved with migraine cocktail - Discharge home with instructions to take benadryl, flexeril, and percocet. Recommended copious fluids over the next 24 hours to help stave off migraine symptoms.  -If returned sx, consider CT head  Federico Flake 10/04/2015, 4:19 PM

## 2015-10-10 ENCOUNTER — Encounter (HOSPITAL_COMMUNITY): Payer: Self-pay

## 2015-10-10 ENCOUNTER — Inpatient Hospital Stay (HOSPITAL_COMMUNITY): Payer: BLUE CROSS/BLUE SHIELD | Admitting: Anesthesiology

## 2015-10-10 ENCOUNTER — Inpatient Hospital Stay (HOSPITAL_COMMUNITY)
Admission: RE | Admit: 2015-10-10 | Discharge: 2015-10-12 | DRG: 775 | Disposition: A | Discharge status: Home / Self Care | Payer: BLUE CROSS/BLUE SHIELD | Source: Ambulatory Visit | Attending: Obstetrics and Gynecology | Admitting: Obstetrics and Gynecology

## 2015-10-10 DIAGNOSIS — O134 Gestational [pregnancy-induced] hypertension without significant proteinuria, complicating childbirth: Principal | ICD-10-CM | POA: Diagnosis present

## 2015-10-10 DIAGNOSIS — O9962 Diseases of the digestive system complicating childbirth: Secondary | ICD-10-CM | POA: Diagnosis present

## 2015-10-10 DIAGNOSIS — O99824 Streptococcus B carrier state complicating childbirth: Secondary | ICD-10-CM | POA: Diagnosis present

## 2015-10-10 DIAGNOSIS — O99284 Endocrine, nutritional and metabolic diseases complicating childbirth: Secondary | ICD-10-CM | POA: Diagnosis present

## 2015-10-10 DIAGNOSIS — K219 Gastro-esophageal reflux disease without esophagitis: Secondary | ICD-10-CM | POA: Diagnosis present

## 2015-10-10 DIAGNOSIS — Z3A39 39 weeks gestation of pregnancy: Secondary | ICD-10-CM

## 2015-10-10 DIAGNOSIS — E039 Hypothyroidism, unspecified: Secondary | ICD-10-CM | POA: Diagnosis present

## 2015-10-10 LAB — PROTEIN / CREATININE RATIO, URINE
Creatinine, Urine: 84 mg/dL
PROTEIN CREATININE RATIO: 0.1 mg/mg{creat} (ref 0.00–0.15)
Total Protein, Urine: 8 mg/dL

## 2015-10-10 LAB — CBC
HCT: 34.2 % — ABNORMAL LOW (ref 36.0–46.0)
HEMATOCRIT: 38.1 % (ref 36.0–46.0)
Hemoglobin: 11.8 g/dL — ABNORMAL LOW (ref 12.0–15.0)
Hemoglobin: 13.2 g/dL (ref 12.0–15.0)
MCH: 29.3 pg (ref 26.0–34.0)
MCH: 30.3 pg (ref 26.0–34.0)
MCHC: 34.5 g/dL (ref 30.0–36.0)
MCHC: 34.6 g/dL (ref 30.0–36.0)
MCV: 84.9 fL (ref 78.0–100.0)
MCV: 87.6 fL (ref 78.0–100.0)
PLATELETS: 247 10*3/uL (ref 150–400)
Platelets: 271 10*3/uL (ref 150–400)
RBC: 4.03 MIL/uL (ref 3.87–5.11)
RBC: 4.35 MIL/uL (ref 3.87–5.11)
RDW: 14.7 % (ref 11.5–15.5)
RDW: 14.8 % (ref 11.5–15.5)
WBC: 14.2 10*3/uL — AB (ref 4.0–10.5)
WBC: 15 10*3/uL — ABNORMAL HIGH (ref 4.0–10.5)

## 2015-10-10 LAB — COMPREHENSIVE METABOLIC PANEL
ALK PHOS: 162 U/L — AB (ref 38–126)
ALT: 11 U/L — AB (ref 14–54)
AST: 15 U/L (ref 15–41)
Albumin: 3.1 g/dL — ABNORMAL LOW (ref 3.5–5.0)
Anion gap: 7 (ref 5–15)
BILIRUBIN TOTAL: 0.5 mg/dL (ref 0.3–1.2)
BUN: 8 mg/dL (ref 6–20)
CALCIUM: 8.7 mg/dL — AB (ref 8.9–10.3)
CHLORIDE: 109 mmol/L (ref 101–111)
CO2: 19 mmol/L — ABNORMAL LOW (ref 22–32)
CREATININE: 0.59 mg/dL (ref 0.44–1.00)
Glucose, Bld: 81 mg/dL (ref 65–99)
Potassium: 3.9 mmol/L (ref 3.5–5.1)
Sodium: 135 mmol/L (ref 135–145)
TOTAL PROTEIN: 6 g/dL — AB (ref 6.5–8.1)

## 2015-10-10 LAB — URIC ACID: URIC ACID, SERUM: 5.6 mg/dL (ref 2.3–6.6)

## 2015-10-10 LAB — TYPE AND SCREEN
ABO/RH(D): A POS
Antibody Screen: NEGATIVE

## 2015-10-10 LAB — ABO/RH: ABO/RH(D): A POS

## 2015-10-10 LAB — LACTATE DEHYDROGENASE: LDH: 128 U/L (ref 98–192)

## 2015-10-10 LAB — RPR: RPR: NONREACTIVE

## 2015-10-10 MED ORDER — ACETAMINOPHEN 325 MG PO TABS
650.0000 mg | ORAL_TABLET | ORAL | Status: DC | PRN
  Administered 2015-10-10: 650 mg via ORAL
  Filled 2015-10-10: qty 2

## 2015-10-10 MED ORDER — FENTANYL CITRATE (PF) 100 MCG/2ML IJ SOLN
50.0000 ug | INTRAMUSCULAR | Status: DC | PRN
  Administered 2015-10-10: 100 ug via INTRAVENOUS
  Filled 2015-10-10: qty 2

## 2015-10-10 MED ORDER — EPHEDRINE 5 MG/ML INJ
10.0000 mg | INTRAVENOUS | Status: DC | PRN
  Filled 2015-10-10: qty 2

## 2015-10-10 MED ORDER — PHENYLEPHRINE 40 MCG/ML (10ML) SYRINGE FOR IV PUSH (FOR BLOOD PRESSURE SUPPORT): 80.0000 ug | PREFILLED_SYRINGE | INTRAVENOUS | Status: DC | PRN

## 2015-10-10 MED ORDER — FENTANYL 2.5 MCG/ML BUPIVACAINE 1/10 % EPIDURAL INFUSION (WH - ANES)
14.0000 mL/h | INTRAMUSCULAR | Status: DC | PRN
  Administered 2015-10-10: 14 mL/h via EPIDURAL
  Filled 2015-10-10: qty 125

## 2015-10-10 MED ORDER — MEASLES, MUMPS & RUBELLA VAC ~~LOC~~ INJ
0.5000 mL | INJECTION | Freq: Once | SUBCUTANEOUS | Status: DC
  Filled 2015-10-10: qty 0.5

## 2015-10-10 MED ORDER — LIDOCAINE HCL (PF) 1 % IJ SOLN
30.0000 mL | INTRAMUSCULAR | Status: DC | PRN
  Filled 2015-10-10: qty 30

## 2015-10-10 MED ORDER — TETANUS-DIPHTH-ACELL PERTUSSIS 5-2.5-18.5 LF-MCG/0.5 IM SUSP: 0.5000 mL | Freq: Once | INTRAMUSCULAR | Status: DC

## 2015-10-10 MED ORDER — SOD CITRATE-CITRIC ACID 500-334 MG/5ML PO SOLN
30.0000 mL | ORAL | Status: DC | PRN
  Administered 2015-10-10: 30 mL via ORAL
  Filled 2015-10-10: qty 15

## 2015-10-10 MED ORDER — ACETAMINOPHEN 325 MG PO TABS: 650.0000 mg | ORAL_TABLET | ORAL | Status: DC | PRN

## 2015-10-10 MED ORDER — OXYTOCIN 40 UNITS IN LACTATED RINGERS INFUSION - SIMPLE MED
2.5000 [IU]/h | INTRAVENOUS | Status: DC
  Filled 2015-10-10: qty 1000

## 2015-10-10 MED ORDER — DEXTROSE 5 % IV SOLN
5.0000 10*6.[IU] | Freq: Once | INTRAVENOUS | Status: AC
  Administered 2015-10-10: 5 10*6.[IU] via INTRAVENOUS
  Filled 2015-10-10: qty 5

## 2015-10-10 MED ORDER — LACTATED RINGERS IV SOLN: 500.0000 mL | Freq: Once | INTRAVENOUS | Status: DC

## 2015-10-10 MED ORDER — MEDROXYPROGESTERONE ACETATE 150 MG/ML IM SUSP: 150.0000 mg | INTRAMUSCULAR | Status: DC | PRN

## 2015-10-10 MED ORDER — DIBUCAINE 1 % RE OINT
1.0000 "application " | TOPICAL_OINTMENT | RECTAL | Status: DC | PRN
  Administered 2015-10-11: 1 via RECTAL
  Filled 2015-10-10: qty 28

## 2015-10-10 MED ORDER — OXYCODONE-ACETAMINOPHEN 5-325 MG PO TABS
2.0000 | ORAL_TABLET | ORAL | Status: DC | PRN
  Administered 2015-10-11 (×3): 2 via ORAL
  Filled 2015-10-10 (×2): qty 2

## 2015-10-10 MED ORDER — OXYCODONE-ACETAMINOPHEN 5-325 MG PO TABS: 2.0000 | ORAL_TABLET | ORAL | Status: DC | PRN

## 2015-10-10 MED ORDER — TERBUTALINE SULFATE 1 MG/ML IJ SOLN
0.2500 mg | Freq: Once | INTRAMUSCULAR | Status: DC | PRN
  Filled 2015-10-10: qty 1

## 2015-10-10 MED ORDER — LACTATED RINGERS IV SOLN: 500.0000 mL | INTRAVENOUS | Status: DC | PRN

## 2015-10-10 MED ORDER — ZOLPIDEM TARTRATE 5 MG PO TABS: 5.0000 mg | ORAL_TABLET | Freq: Every evening | ORAL | Status: DC | PRN

## 2015-10-10 MED ORDER — LACTATED RINGERS IV SOLN
INTRAVENOUS | Status: DC
  Administered 2015-10-10 (×2): via INTRAVENOUS

## 2015-10-10 MED ORDER — SIMETHICONE 80 MG PO CHEW
80.0000 mg | CHEWABLE_TABLET | ORAL | Status: DC | PRN
  Filled 2015-10-10: qty 1

## 2015-10-10 MED ORDER — MISOPROSTOL 25 MCG QUARTER TABLET
25.0000 ug | ORAL_TABLET | ORAL | Status: DC | PRN
  Administered 2015-10-10: 25 ug via VAGINAL
  Filled 2015-10-10 (×2): qty 0.25
  Filled 2015-10-10: qty 1

## 2015-10-10 MED ORDER — EPHEDRINE 5 MG/ML INJ: 10.0000 mg | INTRAVENOUS | Status: DC | PRN

## 2015-10-10 MED ORDER — PHENYLEPHRINE 40 MCG/ML (10ML) SYRINGE FOR IV PUSH (FOR BLOOD PRESSURE SUPPORT)
80.0000 ug | PREFILLED_SYRINGE | INTRAVENOUS | Status: DC | PRN
  Filled 2015-10-10: qty 10
  Filled 2015-10-10: qty 5

## 2015-10-10 MED ORDER — ONDANSETRON HCL 4 MG/2ML IJ SOLN: 4.0000 mg | INTRAMUSCULAR | Status: DC | PRN

## 2015-10-10 MED ORDER — OXYCODONE-ACETAMINOPHEN 5-325 MG PO TABS
1.0000 | ORAL_TABLET | ORAL | Status: DC | PRN
  Filled 2015-10-10 (×2): qty 1

## 2015-10-10 MED ORDER — COCONUT OIL OIL: 1.0000 "application " | TOPICAL_OIL | Status: DC | PRN

## 2015-10-10 MED ORDER — IBUPROFEN 600 MG PO TABS
600.0000 mg | ORAL_TABLET | Freq: Four times a day (QID) | ORAL | Status: DC
  Administered 2015-10-10 – 2015-10-12 (×8): 600 mg via ORAL
  Filled 2015-10-10 (×8): qty 1

## 2015-10-10 MED ORDER — PRENATAL MULTIVITAMIN CH
1.0000 | ORAL_TABLET | Freq: Every day | ORAL | Status: DC
  Administered 2015-10-11 – 2015-10-12 (×2): 1 via ORAL
  Filled 2015-10-10 (×2): qty 1

## 2015-10-10 MED ORDER — OXYCODONE-ACETAMINOPHEN 5-325 MG PO TABS: 1.0000 | ORAL_TABLET | ORAL | Status: DC | PRN

## 2015-10-10 MED ORDER — DIPHENHYDRAMINE HCL 50 MG/ML IJ SOLN: 12.5000 mg | INTRAMUSCULAR | Status: DC | PRN

## 2015-10-10 MED ORDER — PENICILLIN G POTASSIUM 5000000 UNITS IJ SOLR
2.5000 10*6.[IU] | INTRAVENOUS | Status: DC
  Administered 2015-10-10 (×2): 2.5 10*6.[IU] via INTRAVENOUS
  Filled 2015-10-10 (×6): qty 2.5

## 2015-10-10 MED ORDER — WITCH HAZEL-GLYCERIN EX PADS
1.0000 "application " | MEDICATED_PAD | CUTANEOUS | Status: DC | PRN
  Administered 2015-10-11: 1 via TOPICAL

## 2015-10-10 MED ORDER — LIDOCAINE HCL (PF) 1 % IJ SOLN
INTRAMUSCULAR | Status: DC | PRN
  Administered 2015-10-10 (×2): 4 mL via EPIDURAL

## 2015-10-10 MED ORDER — OXYTOCIN BOLUS FROM INFUSION: 500.0000 mL | INTRAVENOUS | Status: DC

## 2015-10-10 MED ORDER — LEVALBUTEROL TARTRATE 45 MCG/ACT IN AERO: 1.0000 | INHALATION_SPRAY | Freq: Every day | RESPIRATORY_TRACT | Status: DC | PRN

## 2015-10-10 MED ORDER — PHENYLEPHRINE 40 MCG/ML (10ML) SYRINGE FOR IV PUSH (FOR BLOOD PRESSURE SUPPORT)
80.0000 ug | PREFILLED_SYRINGE | INTRAVENOUS | Status: DC | PRN
  Filled 2015-10-10: qty 5

## 2015-10-10 MED ORDER — SENNOSIDES-DOCUSATE SODIUM 8.6-50 MG PO TABS
2.0000 | ORAL_TABLET | ORAL | Status: DC
  Administered 2015-10-10 – 2015-10-11 (×2): 2 via ORAL
  Filled 2015-10-10 (×2): qty 2

## 2015-10-10 MED ORDER — ONDANSETRON HCL 4 MG PO TABS: 4.0000 mg | ORAL_TABLET | ORAL | Status: DC | PRN

## 2015-10-10 MED ORDER — BENZOCAINE-MENTHOL 20-0.5 % EX AERO: 1.0000 "application " | INHALATION_SPRAY | CUTANEOUS | Status: DC | PRN

## 2015-10-10 MED ORDER — ONDANSETRON HCL 4 MG/2ML IJ SOLN
4.0000 mg | Freq: Four times a day (QID) | INTRAMUSCULAR | Status: DC | PRN
  Administered 2015-10-10: 4 mg via INTRAVENOUS
  Filled 2015-10-10: qty 2

## 2015-10-10 MED ORDER — ALBUTEROL SULFATE (2.5 MG/3ML) 0.083% IN NEBU: 2.5000 mg | INHALATION_SOLUTION | Freq: Four times a day (QID) | RESPIRATORY_TRACT | Status: DC | PRN

## 2015-10-10 MED ORDER — ALBUTEROL SULFATE (5 MG/ML) 0.5% IN NEBU: 2.5000 mg | INHALATION_SOLUTION | Freq: Four times a day (QID) | RESPIRATORY_TRACT | Status: DC

## 2015-10-10 MED ORDER — DIPHENHYDRAMINE HCL 25 MG PO CAPS: 25.0000 mg | ORAL_CAPSULE | Freq: Four times a day (QID) | ORAL | Status: DC | PRN

## 2015-10-10 NOTE — Anesthesia Preprocedure Evaluation (Addendum)
Anesthesia Evaluation  Patient identified by MRN, date of birth, ID band Patient awake    Reviewed: Allergy & Precautions, NPO status , Patient's Chart, lab work & pertinent test results  Airway Mallampati: II  TM Distance: >3 FB Neck ROM: Full    Dental  (+) Teeth Intact   Pulmonary neg pulmonary ROS,    breath sounds clear to auscultation       Cardiovascular hypertension,  Rhythm:Regular Rate:Normal     Neuro/Psych  Headaches, negative psych ROS   GI/Hepatic Neg liver ROS, GERD  Medicated,  Endo/Other  Hypothyroidism   Renal/GU negative Renal ROS  negative genitourinary   Musculoskeletal negative musculoskeletal ROS (+)   Abdominal   Peds negative pediatric ROS (+)  Hematology negative hematology ROS (+)   Anesthesia Other Findings   Reproductive/Obstetrics (+) Pregnancy                            Lab Results  Component Value Date   WBC 15.0* 10/10/2015   HGB 13.2 10/10/2015   HCT 38.1 10/10/2015   MCV 87.6 10/10/2015   PLT 271 10/10/2015   No results found for: INR, PROTIME   Anesthesia Physical Anesthesia Plan  ASA: II  Anesthesia Plan: Epidural   Post-op Pain Management:    Induction:   Airway Management Planned:   Additional Equipment:   Intra-op Plan:   Post-operative Plan:   Informed Consent: I have reviewed the patients History and Physical, chart, labs and discussed the procedure including the risks, benefits and alternatives for the proposed anesthesia with the patient or authorized representative who has indicated his/her understanding and acceptance.     Plan Discussed with:   Anesthesia Plan Comments:         Anesthesia Quick Evaluation

## 2015-10-10 NOTE — Lactation Note (Signed)
This note was copied from a baby's chart. Lactation Consultation Note  Patient Name: Andrea Feliberto HartsChelsea [name] YNWGN'FToday's Date: 10/10/2015 Reason for consult: Follow-up assessment Baby at 7 hr of life. Mom has pendulous breast with a large areola and flat nipple. She is able to easily manually express. Baby was too sleepy at this attempt to latch.    Maternal Data Has patient been taught Hand Expression?: Yes Does the patient have breastfeeding experience prior to this delivery?: No  Feeding Feeding Type: Breast Fed Length of feed: 0 min  LATCH Score/Interventions                      Lactation Tools Discussed/Used     Consult Status Consult Status: Follow-up Date: 10/11/15 Follow-up type: In-patient    Andrea [name] 10/10/2015, 8:53 PM

## 2015-10-10 NOTE — Lactation Note (Signed)
This note was copied from a baby's chart. Lactation Consultation Note  Patient Name: Andrea Feliberto HartsChelsea Meiner UJWJX'BToday's Date: 10/10/2015 Reason for consult: Follow-up assessment Baby at 8 hr of life and awake. Mom was requesting help with latching. Mom's breast is soft/compressible but it was hard for her to keep the nipple in baby's mouth. Applied #20 NS and baby was able to maintain latch. Reviewed Harmony. Mom will latch the baby on demand 8+/24hr. She will try to stop using the NS as soon as possible. She will pump after feeding when she uses the NS. She will call for help as needed.   Maternal Data Has patient been taught Hand Expression?: Yes Does the patient have breastfeeding experience prior to this delivery?: No  Feeding Feeding Type: Breast Fed Length of feed: 0 min  LATCH Score/Interventions Latch: Repeated attempts needed to sustain latch, nipple held in mouth throughout feeding, stimulation needed to elicit sucking reflex. Intervention(s): Adjust position;Assist with latch;Breast massage;Breast compression  Audible Swallowing: A few with stimulation Intervention(s): Hand expression;Skin to skin  Type of Nipple: Flat  Comfort (Breast/Nipple): Soft / non-tender     Hold (Positioning): Full assist, staff holds infant at breast Intervention(s): Support Pillows;Position options  LATCH Score: 5  Lactation Tools Discussed/Used Tools: Nipple Shields Nipple shield size: 20 Pump Review: Setup, frequency, and cleaning;Milk Storage Initiated by:: ES Date initiated:: 10/10/15   Consult Status Consult Status: Follow-up Date: 10/11/15 Follow-up type: In-patient    Rulon Eisenmengerlizabeth E Morris Longenecker 10/10/2015, 9:56 PM

## 2015-10-10 NOTE — Lactation Note (Signed)
This note was copied from a baby's chart. Lactation Consultation Note  Patient Name: Andrea Feliberto HartsChelsea [name] Today's Date: 10/10/2015 Reason for consult: Initial assessment Baby at 5 hr of life. Mom reports getting baby latched was difficult but once he got on the breast he did well. She stated he has been sleeping since hi last feeding. Discussed baby behavior, feeding frequency, baby belly size, voids, wt loss, breast changes, and nipple care. Mom stated she can manually express, has seen colostrum, and has a spoon in the room. Given lactation handouts. Aware of OP services and support group.    Maternal Data Has patient been taught Hand Expression?: Yes Does the patient have breastfeeding experience prior to this delivery?: No  Feeding Feeding Type: Breast Fed  LATCH Score/Interventions                      Lactation Tools Discussed/Used     Consult Status Consult Status: Follow-up Date: 10/11/15 Follow-up type: In-patient    Rulon Eisenmengerlizabeth E Ramonica Grigg 10/10/2015, 6:49 PM

## 2015-10-10 NOTE — H&P (Signed)
Andrea [name] is a 25 y.o. female presenting for IOL due to gestational HTN with multiple visits with elevated BP and HAs in office and MAU.  GBS +.  Currently without severe Preeclampsia features.. History OB History    Gravida Para Term Preterm AB TAB SAB Ectopic Multiple Living   1              Past Medical History  Diagnosis Date  . Hx of varicella   . Headache   . Anxiety   . Hypothyroidism   . History of cystitis   . History of mononucleosis   . MTHFR mutation (HCC)   . Hypertension    Past Surgical History  Procedure Laterality Date  . No past surgeries     Family History: family history is negative for Asthma, Arthritis, Alcohol abuse, Birth defects, Cancer, COPD, Depression, Diabetes, Drug abuse, Early death, Hearing loss, Heart disease, Hyperlipidemia, Hypertension, Kidney disease, Learning disabilities, Mental illness, Mental retardation, Miscarriages / Stillbirths, Stroke, Vision loss, and Varicose Veins. Social History:  reports that she has never smoked. She has never used smokeless tobacco. She reports that she does not drink alcohol or use illicit drugs.   Prenatal Transfer Tool  Maternal Diabetes: No Genetic Screening: Normal Maternal Ultrasounds/Referrals: Normal Fetal Ultrasounds or other Referrals:  None Maternal Substance Abuse:  No Significant Maternal Medications:  None Significant Maternal Lab Results:  None Other Comments:  None  ROS  Dilation: 2.5 Effacement (%): 60 Station: -3 Exam by:: amwalker,rn Blood pressure 121/83, pulse 66, temperature 98.1 F (36.7 [name]), temperature source Oral, resp. rate 22, height 5\' 2"  (1.575 m), weight 166 lb (75.297 kg), SpO2 99 %. Exam Physical Exam   4/95/-2 AROM clear Prenatal labs: ABO, Rh: --/--/A POS, A POS (06/15 0120) Antibody: NEG (06/15 0120) Rubella: Immune (12/05 0000) RPR: Nonreactive (12/05 0000)  HBsAg: Negative (12/05 0000)  HIV: Non-reactive (12/05 0000)  GBS: Positive (05/27 0000)    Assessment/Plan: IUP at term Gest HTN, stable without severe features of PIH AROM, pitocin, IV abx for GBS Anticipate SVD   Andrea [name] 10/10/2015, 9:09 AM

## 2015-10-10 NOTE — Anesthesia Procedure Notes (Signed)
Epidural Patient location during procedure: OB Start time: 10/10/2015 8:43 AM End time: 10/10/2015 8:50 AM  Staffing Anesthesiologist: Shona SimpsonHOLLIS, Dove Gresham D Performed by: anesthesiologist   Preanesthetic Checklist Completed: patient identified, site marked, surgical consent, pre-op evaluation, timeout performed, IV checked, risks and benefits discussed and monitors and equipment checked  Epidural Patient position: sitting Prep: ChloraPrep Patient monitoring: heart rate, continuous pulse ox and blood pressure Approach: midline Location: L3-L4 Injection technique: LOR saline  Needle:  Needle type: Tuohy  Needle gauge: 17 G Needle length: 9 cm Catheter type: closed end flexible Catheter size: 20 Guage Test dose: negative and 1.5% lidocaine  Assessment Events: blood not aspirated, injection not painful, no injection resistance and no paresthesia  Additional Notes LOR @ 4.5  Patient identified. Risks/Benefits/Options discussed with patient including but not limited to bleeding, infection, nerve damage, paralysis, failed block, incomplete pain control, headache, blood pressure changes, nausea, vomiting, reactions to medications, itching and postpartum back pain. Confirmed with bedside nurse the patient's most recent platelet count. Confirmed with patient that they are not currently taking any anticoagulation, have any bleeding history or any family history of bleeding disorders. Patient expressed understanding and wished to proceed. All questions were answered. Sterile technique was used throughout the entire procedure. Please see nursing notes for vital signs. Test dose was given through epidural catheter and negative prior to continuing to dose epidural or start infusion. Warning signs of high block given to the patient including shortness of breath, tingling/numbness in hands, complete motor block, or any concerning symptoms with instructions to call for help. Patient was given instructions on  fall risk and not to get out of bed. All questions and concerns addressed with instructions to call with any issues or inadequate analgesia.    Reason for block:procedure for pain

## 2015-10-10 NOTE — Anesthesia Postprocedure Evaluation (Signed)
Anesthesia Post Note  Patient: Andrea HallChelsea J [name]  Procedure(s) Performed: * No procedures listed *  Patient location during evaluation: Mother Baby Anesthesia Type: Epidural Level of consciousness: awake and alert Pain management: satisfactory to patient Vital Signs Assessment: post-procedure vital signs reviewed and stable Respiratory status: respiratory function stable Cardiovascular status: stable Postop Assessment: no headache, no backache, epidural receding, patient able to bend at knees, no signs of nausea or vomiting and adequate PO intake Anesthetic complications: no Comments: Comfort level was assessed by AnesthesiaTeam and the patient was pleased with the care, interventions, and services provided by the Department of Anesthesia.     Last Vitals:  Filed Vitals:   10/10/15 1431 10/10/15 1446  BP: 128/89 110/76  Pulse: 72 68  Temp:    Resp:      Last Pain:  Filed Vitals:   10/10/15 1659  PainSc: 7    Pain Goal: Patients Stated Pain Goal: 8 (10/10/15 [id number])               Karleen DolphinFUSSELL,Amarya Kuehl

## 2015-10-11 LAB — CBC
HCT: 32.9 % — ABNORMAL LOW (ref 36.0–46.0)
HEMOGLOBIN: 11.2 g/dL — AB (ref 12.0–15.0)
MCH: 29.4 pg (ref 26.0–34.0)
MCHC: 34 g/dL (ref 30.0–36.0)
MCV: 86.4 fL (ref 78.0–100.0)
PLATELETS: 216 10*3/uL (ref 150–400)
RBC: 3.81 MIL/uL — ABNORMAL LOW (ref 3.87–5.11)
RDW: 14.6 % (ref 11.5–15.5)
WBC: 13.2 10*3/uL — ABNORMAL HIGH (ref 4.0–10.5)

## 2015-10-11 NOTE — Lactation Note (Signed)
This note was copied from a baby's chart. Lactation Consultation Note Follow up visit at 31 hours of age.  RN requests assist with feeding assessment and reports increase in bili levels and mom having concerns about supply. Baby is screaming when Lc arrived.  Mom reports recent feeding.  Baby is screaming and clearly not showing feeding cues at this time.  LC swaddled baby and offered gloved finger and encouraged mom to work on hand expression.  Baby is noted to have a high, narrow palate more anterior than average.  Baby does not extend tongue past lower gum line. LC used gloved fingers to attempt to elevate tongue and was unable to due to possible anchoring by frenulum.  Frenulum is not visible at this time but is felt at anterior of tongue near tip.  Baby makes audible clicking noise on gloved finger.  LC offered baby a drop of expressed colostrum and baby continues to cry, but quickly sucked gloved finger to sleep after a big burp.    MOm denies nipple pain or trauma although mom has bruising noted on left nipple.  Mom has been using NS with feedings and feels she has a good supply of colostrum.  Mom recently pumped and collected about 2mls.  LC requested return demonstration of NS application and mom was placing NS on her skin.  Lc assisted with proper application with #20 not being a good fit with left breast, blanching noted with NS.  LC assisted with reverse pressure to help with edema on left nipple.  LC assisted with hand pumping to help evert nipple prior to NS application.  LC fit mom for #24 NS on left breast and #20 appears to be ok fit on right nipple.  Right nipple does not pull through NS well, but mom reports with previous feedings it has.    Baby has had 8 feedings with 4 voids and 4 stools.  LC encouraged mom to use DEBP to protect her milk supply, 8x/24 hours.   Mom is concerns that baby isn't getting enough at feedings.  LC unable to assess at this time.  Mom does not want to supplement  with formula (bili is increasing per RN) so LC encouraged mom to increase pumping this evening before weight check to collect EBM to give to baby after feedings as dessert. Mom continues to work on hand expression also.  Report given to Shari Heritagen, Sue.  Mom to call for assist as needed.        Patient Name: Andrea [name] WUJWJ'XToday's Date: 10/11/2015 Reason for consult: Follow-up assessment;Difficult latch;Hyperbilirubinemia   Maternal Data Has patient been taught Hand Expression?: Yes  Feeding Feeding Type: Breast Fed  LATCH Score/Interventions Latch: Grasps breast easily, tongue down, lips flanged, rhythmical sucking. Intervention(s): Adjust position;Assist with latch;Breast massage;Breast compression  Audible Swallowing: A few with stimulation Intervention(s): Skin to skin;Hand expression  Type of Nipple: Flat Intervention(s): Hand pump  Comfort (Breast/Nipple): Filling, red/small blisters or bruises, mild/mod discomfort  Problem noted: Mild/Moderate discomfort Interventions (Mild/moderate discomfort): Pre-pump if needed;Hand massage;Hand expression  Hold (Positioning): Assistance needed to correctly position infant at breast and maintain latch. Intervention(s): Breastfeeding basics reviewed  LATCH Score: 6  Lactation Tools Discussed/Used Tools: Pump Breast pump type: Double-Electric Breast Pump   Consult Status Consult Status: Follow-up Date: 10/12/15 Follow-up type: In-patient    Jannifer RodneyShoptaw, Jana Lynn 10/11/2015, 9:15 PM

## 2015-10-11 NOTE — Progress Notes (Signed)
Post Partum Day 1 Subjective: no complaints, up ad lib, voiding, tolerating PO and + flatus  Objective: Blood pressure 109/61, pulse 56, temperature 98 F (36.7 C), temperature source Oral, resp. rate 17, height 5\' 2"  (1.575 m), weight 166 lb (75.297 kg), SpO2 100 %, unknown if currently breastfeeding.  Physical Exam:  General: alert and cooperative Lochia: appropriate Uterine Fundus: firm Incision: perineum intact DVT Evaluation: No evidence of DVT seen on physical exam. Negative Homan's sign. No cords or calf tenderness. No significant calf/ankle edema.   Recent Labs  10/10/15 0746 10/11/15 0619  HGB 13.2 11.2*  HCT 38.1 32.9*    Assessment/Plan: Plan for discharge tomorrow and Circumcision prior to discharge   LOS: 1 day   CURTIS,CAROL G 10/11/2015, 7:59 AM   Agree with above. Patient desires circ.  Counseled re: risk of bleeding, infection, and scarring.  All questions were answered and the patient wishes to proceed.  Mitchel HonourMegan Prince Couey, DO

## 2015-10-11 NOTE — Clinical Social Work Maternal (Signed)
CLINICAL SOCIAL WORK MATERNAL/CHILD NOTE  Patient Details  Name: Andrea [name] MRN: [id number] Date of Birth: 26-Jun-1990  Date:  10/11/2015  Clinical Social Worker Initiating Note:  Soo Steelman E. Brigitte Pulse, Sobieski Date/ Time Initiated:  10/11/15/1330     Child's Name:  Andrea [name]   Legal Guardian:   (Parents: Philbert Riser and Vivien Rota)   Need for Interpreter:  None   Date of Referral:  10/11/15     Reason for Referral:  Other (Comment) (Hx of Anxiety)   Referral Source:  [facility]   Address:  [address], [place], [place] [address]  Phone number:  [phone]   Household Members:  Significant Other   Natural Supports (not living in the home):  Immediate Family, Extended Family (Parents report having a great support system.  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  MOB's facesheet notes religion as [place].  Strengths:  Ability to meet basic needs , Compliance with medical plan , Home prepared for child , Other (Comment) (Open to talking about her feelings)   Risk Factors/Current Problems:  Other (Comment) (Hx of Anxiety)   Cognitive State:  Able to Concentrate , Alert , Linear Thinking , Goal Oriented , Insightful    Mood/Affect:  Euthymic , Interested , Calm , Comfortable , Relaxed    CSW Assessment: CSW met with parents in MOB's first floor room/124 to offer support and complete assessment for hx of Anxiety.  Parents were pleasant, receptive of CSW's visit and easy to engage.  Both parents participated in the conversation. CSW inquired about their experience through labor and delivery.  MOB  states she was really hoping for a natural birth, but the circumstances made it so an epidural was suggested.  She states she accepted the recommendation and can now see that this was beneficial to the process.  She added that she wonders why she didn't do it earlier and states no negative feelings about herself in not being able to deliver the way she had planned.  CSW commends her for being able to process her experience and adapt to changes in expectations.   MOB states baby is doing great with feeding and sleeping and that she feels calm and relaxed.  MOB appears very calm and relaxed as she breast fed infant while CSW was present.  She states she has been with friends when they've had newborns and feels things usually do not go as smoothly as they have for her and her baby.  She seems pleasantly surprised, but reports she's not sure what to think of it.  CSW suggests she celebrate how well things are going and know that this may not be how it always is.  CSW provided education regarding perinatal mood disorders and talked at length about the importance of talking with a medical professional if symptoms arise.  CSW also discussed commons emotions often experienced in the first couple of weeks after birth while hormones are regulating.  MOB acknowledges increased emotions now, but feels this is normal for the first day after delivery.  She states she felt sad when baby left the room for  his circumcision and that she had trouble sleeping because she just wanted to be with baby.  CSW validated her feelings and stated how normal these reactions are on the first day.  MOB states she feels comfortable talking with her doctor if she has concerns about her emotions at any time.  CSW explained that fathers can also experience some of the same emotions while transitioning to fatherhood and encouraged good communication between parents.  CSW added that sometimes it is hard to see the concern in ourselves and that FOB has  an importance job of talking to MOB if he notes concerns.  Both parents were very engaged and attentive to information given by CSW. MOB reports a history of mild Anxiety characterized by "feeling down, but not depressed."  She explained that she allows things to build up and "get's quiet."  She reports she has felt better lately as FOB notices these times and encourages her to talk.  MOB states she feels better when she talks about what is bothering her instead of letting it build up.  CSW asked her to remember this when she is feeling things build up and encouraged good communication, especially now as parents.  MOB states she felt anxious initially because she was not sure how her parents were going to react to the news that she was pregnant, since she and FOB are not married.  She states this hurt her feelings.  CSW asked if they reacted negatively or if she anticipated them acting negatively.  MOB states they were happy for her and have been very involved and supportive, as have FOB's parents.  CSW cautioned MOB to not base her feelings on her expectations, but rather address her feelings based on reality.  MOB stated understanding.  She reports feeling less anxious now than she did at the beginning of pregnancy as she has seen the support from her family.  CSW encouraged parents to call on their support people when needed.  They report everyone is close by and eager to help with baby. Parents report no further questions, concerns or need for CSW intervention at this time and seemed appreciative of CSW's concern for their emotional wellbeing.  CSW identifies no barriers to discharge when MOB and baby are medically ready.    CSW Plan/Description:  No Further Intervention Required/No Barriers to Discharge, Patient/Family Education     Alphonzo Cruise, [place] 10/11/2015, 2:20 PM

## 2015-10-12 MED ORDER — IBUPROFEN 600 MG PO TABS: 600.0000 mg | ORAL_TABLET | Freq: Four times a day (QID) | ORAL | Status: DC

## 2015-10-12 MED ORDER — OXYCODONE-ACETAMINOPHEN 5-325 MG PO TABS: 1.0000 | ORAL_TABLET | ORAL | Status: DC | PRN

## 2015-10-12 NOTE — Discharge Instructions (Signed)
Call MD for T>100.4, heavy vaginal bleeding, severe abdominal pain, or respiratory distress.  Call office to schedule postpartum visit in 4-6 weeks.  No driving while taking narcotics.  Pelvic rest x 6 weeks. °

## 2015-10-12 NOTE — Discharge Summary (Signed)
Obstetric Discharge Summary Reason for Admission: induction of labor Prenatal Procedures: none Intrapartum Procedures: spontaneous vaginal delivery Postpartum Procedures: none Complications-Operative and Postpartum: none HEMOGLOBIN  Date Value Ref Range Status  10/11/2015 11.2* 12.0 - 15.0 g/dL Final   HCT  Date Value Ref Range Status  10/11/2015 32.9* 36.0 - 46.0 % Final    Physical Exam:  General: alert, cooperative and appears stated age Lochia: appropriate Uterine Fundus: firm Incision: healing well DVT Evaluation: No evidence of DVT seen on physical exam. Negative Homan's sign. No cords or calf tenderness.  Discharge Diagnoses: Term Pregnancy-delivered  Discharge Information: Date: 10/12/2015 Activity: pelvic rest Diet: routine Medications: PNV, Ibuprofen and Percocet Condition: stable Instructions: refer to practice specific booklet Discharge to: home   Newborn Data: Live born female  Birth Weight: 6 lb 4.5 oz (2850 g) APGAR: 8, 9  Home with mother.  Andrea [name] 10/12/2015, 9:20 AM

## 2015-10-12 NOTE — Progress Notes (Signed)
Post Partum Day 2 Subjective: no complaints, up ad lib, voiding, tolerating PO and + flatus  Objective: Blood pressure 99/62, pulse 75, temperature 98.3 F (36.8 C), temperature source Oral, resp. rate 18, height 5\' 2"  (1.575 m), weight 166 lb (75.297 kg), SpO2 100 %, unknown if currently breastfeeding.  Physical Exam:  General: alert, cooperative and appears stated age Lochia: appropriate Uterine Fundus: firm Incision: healing well, no significant drainage, no dehiscence DVT Evaluation: No evidence of DVT seen on physical exam. Negative Homan's sign. No cords or calf tenderness.   Recent Labs  10/10/15 0746 10/11/15 0619  HGB 13.2 11.2*  HCT 38.1 32.9*    Assessment/Plan: Discharge home and Breastfeeding   LOS: 2 days   Andrea [name] 10/12/2015, 9:16 AM

## 2015-10-12 NOTE — Lactation Note (Signed)
This note was copied from a baby's chart. Lactation Consultation Note  Baby is 446 hours old.  Mom states breastfeeding is going much better.  Mom is pumping every 2-3 hours and obtaining 3+ mls of colostrum.  Parents are spoon feeding this to baby and state he does very well.  Observed mom latch baby to left breast using a 24 mm nipple shield.  Mom uses a modified football hold.  Baby latching deeply to breast and good swallows observed.  Mom states she does see colostrum in shield after feeding.  Instructed to continue with post pumping and giving any expressed milk back to baby.  Encouraged to call out for assist/concerns.  Patient Name: Andrea Feliberto HartsChelsea Griffey ZOXWR'UToday's Date: [name] Reason for consult: Follow-up assessment;Difficult latch;Infant < 6lbs   Maternal Data    Feeding Feeding Type: Breast Fed  LATCH Score/Interventions Latch: Grasps breast easily, tongue down, lips flanged, rhythmical sucking. Intervention(s): Breast massage;Breast compression  Audible Swallowing: Spontaneous and intermittent Intervention(s): Hand expression;Alternate breast massage  Type of Nipple: Flat  Comfort (Breast/Nipple): Soft / non-tender     Hold (Positioning): No assistance needed to correctly position infant at breast. Intervention(s): Breastfeeding basics reviewed;Skin to skin  LATCH Score: 9  Lactation Tools Discussed/Used Tools: Nipple Shields Nipple shield size: 24   Consult Status Consult Status: Follow-up Date: 10/13/15 Follow-up type: In-patient    Huston FoleyMOULDEN, Jelene Albano S [name], 12:06 PM

## 2015-10-13 ENCOUNTER — Ambulatory Visit: Payer: Self-pay

## 2015-10-13 NOTE — Lactation Note (Signed)
This note was copied from a baby's chart. Lactation Consultation Note  Follow up visit made.  Mom is currently breastfeeding baby using a 24 mm nipple shield.  Baby has been on the breast for over 60 minutes.  Baby is nursing actively with swallows noted.  I had a long discussion with parents about weight loss and importance of baby getting adequate calories for weight to stabilize.  I recommended they limit feeding at breast to 30 minutes.  Instructed to pump every 2 hours so we have additional calories to give to baby.  Mom will pump now and I will return to teach syringe feeding.  Baby slept for 6 hours last night without feeding.  Reviewed importance of not allowing baby to go more than 3 hours.  Mom's breasts are becoming fuller and milk present in nipple shield after feeds.  Discussed plan with Dr. Margo AyeHall.  Will continue to follow and reweigh baby later.  Patient Name: Andrea [name]'XToday's Date: 10/13/2015 Reason for consult: Follow-up assessment;Infant weight loss;Infant < 6lbs   Maternal Data    Feeding Feeding Type: Breast Fed Length of feed: 60 min  LATCH Score/Interventions Latch: Grasps breast easily, tongue down, lips flanged, rhythmical sucking.  Audible Swallowing: Spontaneous and intermittent  Type of Nipple: Everted at rest and after stimulation Intervention(s): Double electric pump  Comfort (Breast/Nipple): Soft / non-tender     Hold (Positioning): No assistance needed to correctly position infant at breast. Intervention(s): Breastfeeding basics reviewed  LATCH Score: 10  Lactation Tools Discussed/Used Tools: Nipple Shields Nipple shield size: 24   Consult Status      Huston FoleyMOULDEN, Aslan Himes S 10/13/2015, 9:10 AM

## 2015-10-13 NOTE — Lactation Note (Signed)
This note was copied from a baby's chart. Lactation Consultation Note  Mom just pumped 10 mls of transitional milk and instructed parents on syringe feeding.  Baby very eager and tolerated well.  Mom states she would like to stay another night so she won't be worried at home.  Tight lingual frenulum noted.  Baby has difficulty extending tongue.  FOB states he has the same tongue and cannot stick his tongue out.  I will follow up in two hours for parents to return syringe feeding demonstration.    Patient Name: Andrea Feliberto HartsChelsea [name] WJXBJ'YToday's Date: 10/13/2015 Reason for consult: Follow-up assessment   Maternal Data    Feeding Feeding Type: Breast Fed Length of feed: 60 min  LATCH Score/Interventions Latch: Grasps breast easily, tongue down, lips flanged, rhythmical sucking.  Audible Swallowing: Spontaneous and intermittent  Type of Nipple: Everted at rest and after stimulation Intervention(s): Double electric pump  Comfort (Breast/Nipple): Soft / non-tender     Hold (Positioning): No assistance needed to correctly position infant at breast. Intervention(s): Breastfeeding basics reviewed  LATCH Score: 10  Lactation Tools Discussed/Used Tools: Nipple Shields Nipple shield size: 24   Consult Status Consult Status: Follow-up Date: 10/14/15 Follow-up type: In-patient    Huston FoleyMOULDEN, Jordanna Hendrie S 10/13/2015, 9:53 AM

## 2015-10-13 NOTE — Lactation Note (Signed)
This note was copied from a baby's chart. Lactation Consultation Note  Follow up to assist with syringe feeding.  Parents desire to use bottle instead.  Mom pumped 30 mls of transitional milk.  Assisted with paced bottle feeding and baby took 20 mls and fell asleep.  Instructed mom to feed with cues and pump every 3 hours instead of 2 hours since volume is increasing.  Encouraged to give baby expressed milk until satiated.  Patient Name: Boy Feliberto HartsChelsea Thursby WUJWJ'XToday's Date: 10/13/2015 Reason for consult: Follow-up assessment;Infant < 6lbs;Infant weight loss   Maternal Data    Feeding Feeding Type: Breast Fed  LATCH Score/Interventions Latch: Grasps breast easily, tongue down, lips flanged, rhythmical sucking.  Audible Swallowing: Spontaneous and intermittent  Type of Nipple: Everted at rest and after stimulation Intervention(s): Double electric pump  Comfort (Breast/Nipple): Soft / non-tender     Hold (Positioning): No assistance needed to correctly position infant at breast. Intervention(s): Breastfeeding basics reviewed  LATCH Score: 10  Lactation Tools Discussed/Used Tools: Nipple Shields Nipple shield size: 24   Consult Status Consult Status: Follow-up Date: 10/14/15 Follow-up type: In-patient    Huston FoleyMOULDEN, Shondrea Steinert S 10/13/2015, 12:02 PM

## 2015-10-13 NOTE — Lactation Note (Addendum)
This note was copied from a baby's chart. Lactation Consultation Note  Patient Name: Boy Feliberto HartsChelsea Pennino ZOXWR'UToday's Date: 10/13/2015 Reason for consult: Follow-up assessment RN requested LC see Mom. Mom reporting right breast engorged and milk not moving with BF or pumping. RN had Mom get in shower and do some massage. Mom pumping when LC arrived. Left breast soft and right breast softening. Mom has ice packs to apply after pumping. Mom is BF, pumping and supplementing with EBM with feedings. Encouraged to BF both breasts each feeding for up to 20 minutes. Post pump to comfort as needed, apply ice packs after each feeding. Advised breast need to be emptied every 2-3 hours.  If difficult latch due to breast fullness, pre-pump as needed to help with latch. Mom plans to continue to supplement after feeding with EBM via bottle. Mom using nipple shield to latch. Wants to supplement with feedings due to weight loss. Advised Mom that now that her milk is in baby should be transferring more milk at breast and may not take as much supplement or need as much supplement if nursing well. Mom does report lots of breast milk in nipple shield. Mom to schedule OP f/u at d/c.   Maternal Data    Feeding Feeding Type: Breast Milk  LATCH Score/Interventions          Comfort (Breast/Nipple): Engorged, cracked, bleeding, large blisters, severe discomfort Intervention(s): Ice;Hand expression  Interventions (Mild/moderate discomfort): Post-pump;Pre-pump if needed        Lactation Tools Discussed/Used     Consult Status Consult Status: Follow-up Date: 10/14/15 Follow-up type: In-patient    Alfred LevinsGranger, Kha Hari Ann 10/13/2015, 10:35 PM

## 2015-10-14 ENCOUNTER — Ambulatory Visit: Payer: Self-pay

## 2015-10-14 NOTE — Lactation Note (Signed)
This note was copied from a baby's chart. Lactation Consultation Note  Mother is satisfied with the way feedings are going.  SHe is BF using the # 24 NS and also pumping and bottle feeding. Outpatient appointment scheduled for Friday.  [facility].  [facility]  Patient Name: Andrea Feliberto HartsChelsea [name] Today's Date: 10/14/2015 Reason for consult: Follow-up assessment   Maternal Data    Feeding Feeding Type: Breast Milk  LATCH Score/Interventions Latch:  (instructed mother to call for next latch)                    Lactation Tools Discussed/Used     Consult Status      Soyla DryerJoseph, Carletha Dawn 10/14/2015, 10:43 AM

## 2015-10-14 NOTE — Lactation Note (Addendum)
This note was copied from a baby's chart. Lactation Consultation Note  Loaner pump obtained.  Patient Name: Boy Feliberto HartsChelsea Lukin EAVWU'JToday's Date: 10/14/2015 Reason for consult: Follow-up assessment   Maternal Data    Feeding Feeding Type: Breast Milk  LATCH Score/Interventions                      Lactation Tools Discussed/Used     Consult Status      Soyla DryerJoseph, Marilyn Wing 10/14/2015, 1:09 PM

## 2015-10-18 ENCOUNTER — Ambulatory Visit (HOSPITAL_COMMUNITY)
Admission: RE | Admit: 2015-10-18 | Discharge: 2015-10-18 | Disposition: A | Discharge status: Home / Self Care | Payer: BLUE CROSS/BLUE SHIELD | Source: Ambulatory Visit | Attending: Obstetrics and Gynecology | Admitting: Obstetrics and Gynecology

## 2015-10-18 NOTE — Lactation Note (Addendum)
Lactation Consult baby is 35 days old.  Mom reports increase in weight and alternating breast and bottle feedings.  Mom is post pumping every 3 hours and getting 4-5 oz with each pumping.  Mom is using NS but baby is not getting a deep latch.  Possibly due to tongue mobility limitations.  LC encouraged mom to work on deeper latching with wide gape and swallows with massaging. Mom reports pain with out NS and baby biting.  LC encouraged mom to work on breast feedings at each feeding with post pumping for comfort and to offer EBM as supplement to make sure baby is getting enough.  Mom has follow up appointment for weight check next week Thursday and will follow up with LC on following Monday 7/3 at 12:00.  Mom will need to work on balancing milk supply and baby transferring well at the breast.  Plan for today is to reduce pumping to regulate milk supply and allow baby more feedings at the breast to assess if baby will be able to exclusively latch for feedings. Dr. Arnoldo Hooker is peds at [facility], phone # available but not fax and not in epic so unable to forward this consult.  Office is closed at this time.      Mother's reason for visit: Feeding assessment Visit Type: Feeding assessment Appointment Notes:  Baby has tongue restrictions and is using NS, mom is pumping and alternating bottle and breast feedings.   Consult:  Initial Lactation Consultant:  Jannifer Rodney  ________________________________________________________________________  Andrea [name] Name: Andrea [name] Date of Birth: 10/10/2014 Pediatrician: Dr. Harl Bowie Gender: female Gestational Age: [redacted]w[redacted]d (At Birth) Birth Weight: 6 lb 4.5 oz (2850 g) Weight at Discharge: Weight: 5 lb 14.2 oz (2670 g)Date of Discharge: 10/14/2015 Filed Weights   10/12/15 0148 10/13/15 0015 10/14/15 0020  Weight: 5 lb 13.1 oz (2639 g) 5 lb 11 oz (2580 g) 5 lb 14.2 oz (2670 g)   Last weight taken from location outside of [facility]:6#1today at peds Weight today:6# .o6oz  ________________________________________________________________________  Mother's Name: [name] Type of delivery:   Breastfeeding Experience:  First baby Maternal Medical Conditions:not currently on thyroid medication but is being monitored   ________________________________________________________________________  Breastfeeding History (Post Discharge)  Frequency of breastfeeding: every 6 hours alternating with bottle feedings Duration of feeding:20-60min  Supplementation  Breastmilk:  30-60ml Frequency: every 3 hours   Method:  Bottle,   Pumping  Type of pump:  Medela pump in style Frequency:  Every 3 hours Volume:  120-128ml over supply right now  Infant Intake and Output Assessment  Voids:  12in 24 hrs.   Stools: 8-10 in 24 hrs  Maternal Breast Assessment  Breast:  Full Nipple:  Erect Pain level:  0 _______________________________________________________________________ Feeding Assessment/Evaluation  Initial feeding assessment:  Infant's oral assessment:  Variance Baby only extends the tongue slightly with gloved finger oral assessment, baby noted to bite.  Baby does not elevate tongue well even with gloved finger lifting under tongue to visualize frenulum. Mom is using NS due to baby not holding breast and staying latched well.  Baby is biting when not using NS and causing mom pain.  Mom has abundant supply of milk and baby is transferring.  LC concerned about how well baby is using tongue to stimulate breast.   Positioning:  Cross cradle Left breast  LATCH documentation:  Latch:  2 = Grasps breast easily, tongue down, lips flanged, rhythmical sucking.  Audible swallowing:  2 = Spontaneous  and intermittent  Type of nipple:  2 = Everted at rest and after stimulation  Comfort (Breast/Nipple):  2 = Soft / non-tender  Hold (Positioning):  1 = Assistance needed to correctly position infant at  breast and maintain latch  LATCH score:  9  Attached assessment:  Shallow Even with NS baby does not tolerate deep latching and baby transferred with shallow latch  Lips flanged:  Yes.  with rolling by LC  Lips untucked:  Yes.    Suck assessment:  Displays both  Tools:  Nipple shield 20 mm mom was using #24, nipple accomodates both size #20 and #24, but baby hold NS and gets deeper latch with #20.  Instructed on use and cleaning of tool:  No.  Pre-feed weight:  2738 g  (6 lb. .06 oz.) Post-feed weight:  2784 g (6b. 2.2oz.) Amount transferred:  46 ml Amount supplemented:  0 ml, baby is satisfied with with feeding.  Total amount transferred:  46ml Total supplement given: 0 ml

## 2015-10-28 ENCOUNTER — Encounter (HOSPITAL_COMMUNITY): Payer: BLUE CROSS/BLUE SHIELD

## 2015-11-06 ENCOUNTER — Ambulatory Visit (HOSPITAL_COMMUNITY): Admission: RE | Admit: 2015-11-06 | Payer: BLUE CROSS/BLUE SHIELD | Source: Ambulatory Visit

## 2016-04-27 HISTORY — PX: WISDOM TOOTH EXTRACTION: SHX21

## 2016-06-22 ENCOUNTER — Emergency Department (HOSPITAL_COMMUNITY): Payer: BLUE CROSS/BLUE SHIELD

## 2016-06-22 ENCOUNTER — Encounter (HOSPITAL_COMMUNITY): Payer: Self-pay | Admitting: *Deleted

## 2016-06-22 ENCOUNTER — Emergency Department (HOSPITAL_COMMUNITY)
Admission: EM | Admit: 2016-06-22 | Discharge: 2016-06-22 | Disposition: A | Discharge status: Home / Self Care | Payer: BLUE CROSS/BLUE SHIELD | Attending: Emergency Medicine | Admitting: Emergency Medicine

## 2016-06-22 DIAGNOSIS — N83201 Unspecified ovarian cyst, right side: Secondary | ICD-10-CM

## 2016-06-22 DIAGNOSIS — E039 Hypothyroidism, unspecified: Secondary | ICD-10-CM | POA: Insufficient documentation

## 2016-06-22 DIAGNOSIS — Z79899 Other long term (current) drug therapy: Secondary | ICD-10-CM | POA: Insufficient documentation

## 2016-06-22 DIAGNOSIS — R1032 Left lower quadrant pain: Secondary | ICD-10-CM | POA: Diagnosis present

## 2016-06-22 LAB — URINALYSIS, ROUTINE W REFLEX MICROSCOPIC
Bilirubin Urine: NEGATIVE
GLUCOSE, UA: NEGATIVE mg/dL
Hgb urine dipstick: NEGATIVE
Ketones, ur: NEGATIVE mg/dL
LEUKOCYTES UA: NEGATIVE
Nitrite: NEGATIVE
PH: 7 (ref 5.0–8.0)
Protein, ur: NEGATIVE mg/dL
Specific Gravity, Urine: 1.006 (ref 1.005–1.030)

## 2016-06-22 LAB — COMPREHENSIVE METABOLIC PANEL
ALT: 12 U/L — ABNORMAL LOW (ref 14–54)
AST: 14 U/L — AB (ref 15–41)
Albumin: 4.5 g/dL (ref 3.5–5.0)
Alkaline Phosphatase: 72 U/L (ref 38–126)
Anion gap: 7 (ref 5–15)
BUN: <5 mg/dL — ABNORMAL LOW (ref 6–20)
CHLORIDE: 107 mmol/L (ref 101–111)
CO2: 25 mmol/L (ref 22–32)
Calcium: 9.1 mg/dL (ref 8.9–10.3)
Creatinine, Ser: 0.71 mg/dL (ref 0.44–1.00)
Glucose, Bld: 102 mg/dL — ABNORMAL HIGH (ref 65–99)
POTASSIUM: 3.6 mmol/L (ref 3.5–5.1)
Sodium: 139 mmol/L (ref 135–145)
Total Bilirubin: 0.5 mg/dL (ref 0.3–1.2)
Total Protein: 6.8 g/dL (ref 6.5–8.1)

## 2016-06-22 LAB — CBC
HEMATOCRIT: 41.2 % (ref 36.0–46.0)
HEMOGLOBIN: 13.5 g/dL (ref 12.0–15.0)
MCH: 29.7 pg (ref 26.0–34.0)
MCHC: 32.8 g/dL (ref 30.0–36.0)
MCV: 90.7 fL (ref 78.0–100.0)
Platelets: 304 10*3/uL (ref 150–400)
RBC: 4.54 MIL/uL (ref 3.87–5.11)
RDW: 13.9 % (ref 11.5–15.5)
WBC: 6.1 10*3/uL (ref 4.0–10.5)

## 2016-06-22 LAB — LIPASE, BLOOD: LIPASE: 22 U/L (ref 11–51)

## 2016-06-22 LAB — I-STAT BETA HCG BLOOD, ED (MC, WL, AP ONLY): I-stat hCG, quantitative: <5 m[IU]/mL (ref <5)

## 2016-06-22 MED ORDER — IOPAMIDOL (ISOVUE-300) INJECTION 61%
INTRAVENOUS | Status: AC
Start: 2016-06-22 — End: 2016-06-22
  Administered 2016-06-22: 100 mL
  Filled 2016-06-22: qty 100

## 2016-06-22 MED ORDER — HYDROCODONE-ACETAMINOPHEN 5-325 MG PO TABS: 2.0000 | ORAL_TABLET | ORAL | 0 refills | Status: DC | PRN

## 2016-06-22 MED ORDER — IBUPROFEN 800 MG PO TABS: 800.0000 mg | ORAL_TABLET | Freq: Three times a day (TID) | ORAL | 0 refills | Status: DC

## 2016-06-22 NOTE — ED Notes (Signed)
Pt transported to CT at this time.

## 2016-06-22 NOTE — ED Notes (Signed)
Pt A&OX4, ambulatory at d/c with steady gait, NAD 

## 2016-06-22 NOTE — ED Provider Notes (Signed)
[facility]-EMERGENCY DEPT Provider Note   CSN: [id number] Arrival date & time: 06/22/16  [id number]     History   Chief Complaint Chief Complaint  Patient presents with  . Abdominal Pain    HPI Andrea [name] is a 26 y.o. female.  The history is provided by the patient. No language interpreter was used.  Abdominal Pain   This is a new problem. The current episode started yesterday. The problem occurs constantly. The problem has been gradually worsening. The pain is associated with eating. The pain is located in the RLQ. The pain is moderate. Associated symptoms include anorexia and fever. Pertinent negatives include dysuria. Nothing aggravates the symptoms. Nothing relieves the symptoms. Her past medical history does not include GERD.   Pt reports she has pain in her right lower abdomen.  Pt reports pain began yesterday.  Pt reports not feeling well for 2 days.  Pt reports no appetite.   Pt's gyn advised her to come here for evaluation for possible appendicitis Past Medical History:  Diagnosis Date  . Anxiety   . Headache   . History of cystitis   . History of mononucleosis   . Hx of varicella   . Hypothyroidism   . MTHFR mutation [facility])     Patient Active Problem List   Diagnosis Date Noted  . Labor and delivery, indication for care 10/10/2015    Past Surgical History:  Procedure Laterality Date  . NO PAST SURGERIES      OB History    Gravida Para Term Preterm AB Living   1 1 1     1    SAB TAB Ectopic Multiple Live Births         0 1       Home Medications    Prior to Admission medications   Medication Sig Start Date End Date Taking? Authorizing Provider  acetaminophen (TYLENOL) 500 MG tablet Take 1,000 mg by mouth every 6 (six) hours as needed for moderate pain.    Historical Provider, MD  butalbital-acetaminophen-caffeine (FIORICET, ESGIC) 50-325-40 MG tablet Take 1-2 tablets by mouth every 6 (six) hours as needed. For headache 10/02/15   Historical Provider, MD    cyclobenzaprine (FLEXERIL) 10 MG tablet Take 1 tablet (10 mg total) by mouth 3 (three) times daily as needed (Headache). 10/04/15   Federico FlakeKimberly Niles Newton, MD  ibuprofen (ADVIL,MOTRIN) 600 MG tablet Take 1 tablet (600 mg total) by mouth every 6 (six) hours. 10/12/15   Megan Morris, DO  levalbuterol (XOPENEX HFA) 45 MCG/ACT inhaler Inhale 1 puff into the lungs daily as needed for wheezing or shortness of breath.  08/30/12   Historical Provider, MD  NON FORMULARY Take 35 mcg by mouth daily. Liothyron 35 mcg    Historical Provider, MD  oxyCODONE-acetaminophen (PERCOCET/ROXICET) 5-325 MG tablet Take 1 tablet by mouth every 4 (four) hours as needed (pain scale 4-7). 10/12/15   Mitchel HonourMegan Morris, DO  Prenatal Vit-Fe Fumarate-FA (PRENATAL MULTIVITAMIN) TABS tablet Take 1 tablet by mouth daily at 12 noon.    Historical Provider, MD    Family History Family History  Problem Relation Age of Onset  . Asthma Neg Hx   . Arthritis Neg Hx   . Alcohol abuse Neg Hx   . Birth defects Neg Hx   . Cancer Neg Hx   . COPD Neg Hx   . Depression Neg Hx   . Diabetes Neg Hx   . Drug abuse Neg Hx   . Early death Neg Hx   .  Hearing loss Neg Hx   . Heart disease Neg Hx   . Hyperlipidemia Neg Hx   . Hypertension Neg Hx   . Kidney disease Neg Hx   . Learning disabilities Neg Hx   . Mental illness Neg Hx   . Mental retardation Neg Hx   . Miscarriages / Stillbirths Neg Hx   . Stroke Neg Hx   . Vision loss Neg Hx   . Varicose Veins Neg Hx     Social History Social History  Substance Use Topics  . Smoking status: Never Smoker  . Smokeless tobacco: Never Used  . Alcohol use No     Allergies   Codeine   Review of Systems Review of Systems  Constitutional: Positive for fever.  Gastrointestinal: Positive for abdominal pain and anorexia.  Genitourinary: Negative for dysuria.  All other systems reviewed and are negative.    Physical Exam Updated Vital Signs BP 111/55   Pulse 68   Temp 99.1 F (37.3 C)  (Oral)   Resp 16   Ht 5\' 2"  (1.575 m)   LMP 01/21/2016   SpO2 99%   Physical Exam  Constitutional: She is oriented to person, place, and time. She appears well-developed and well-nourished.  HENT:  Head: Normocephalic.  Right Ear: External ear normal.  Left Ear: External ear normal.  Eyes: EOM are normal. Pupils are equal, round, and reactive to light.  Neck: Normal range of motion.  Cardiovascular: Normal rate.   Pulmonary/Chest: Effort normal and breath sounds normal.  Abdominal: She exhibits no distension. There is tenderness.  Musculoskeletal: Normal range of motion.  Neurological: She is alert and oriented to person, place, and time.  Skin: Skin is warm.  Psychiatric: She has a normal mood and affect.  Nursing note and vitals reviewed.    ED Treatments / Results  Labs (all labs ordered are listed, but only abnormal results are displayed) Labs Reviewed  COMPREHENSIVE METABOLIC PANEL - Abnormal; Notable for the following:       Result Value   Glucose, Bld 102 (*)    BUN <5 (*)    AST 14 (*)    ALT 12 (*)    All other components within normal limits  URINALYSIS, ROUTINE W REFLEX MICROSCOPIC - Abnormal; Notable for the following:    Color, Urine STRAW (*)    All other components within normal limits  LIPASE, BLOOD  CBC  I-STAT BETA HCG BLOOD, ED ([facility], [facility], [facility] ONLY)    EKG  EKG Interpretation None       Radiology Ct Abdomen Pelvis W Contrast  Result Date: 06/22/2016 CLINICAL DATA:  Right lower quadrant pain and fever EXAM: CT ABDOMEN AND PELVIS WITH CONTRAST TECHNIQUE: Multidetector CT imaging of the abdomen and pelvis was performed using the standard protocol following bolus administration of intravenous contrast. CONTRAST:  ISOVUE-300 IOPAMIDOL (ISOVUE-300) INJECTION 61% COMPARISON:  None. FINDINGS: Lower chest: No acute abnormality. Hepatobiliary: No focal liver abnormality is seen. No gallstones, gallbladder wall thickening, or biliary dilatation.  Pancreas: Unremarkable. No pancreatic ductal dilatation or surrounding inflammatory changes. Spleen: Normal in size without focal abnormality. Adrenals/Urinary Tract: Adrenal glands are unremarkable. Kidneys are normal, without renal calculi, focal lesion, or hydronephrosis. Bladder is unremarkable. Stomach/Bowel: The appendix is not well visualized. No inflammatory changes are identified to suggest appendicitis. No obstructive changes are noted. Vascular/Lymphatic: No significant vascular findings are present. No enlarged abdominal or pelvic lymph nodes. Reproductive: Uterus is within normal limits. An IUD is noted in place. A  large 6.1 cm cyst is noted arising from the right ovary and likely contributing to the patient's discomfort. Other: Mild free fluid is noted. This may be related to a ruptured ovarian cyst Musculoskeletal: No acute or significant osseous findings. IMPRESSION: Nonvisualization of the appendix. No inflammatory changes are noted. Large right ovarian cyst as well as mild free fluid within the pelvic cul-de-sac. This could be related to recent cyst rupture. No other focal abnormality is noted. Electronically Signed   By: Alcide Clever M.D.   On: 06/22/2016 11:35    Procedures Procedures (including critical care time)  Medications Ordered in ED Medications  iopamidol (ISOVUE-300) 61 % injection (100 mLs  Contrast Given 06/22/16 1125)     Initial Impression / Assessment and Plan / ED Course  I have reviewed the triage vital signs and the nursing notes.  Pertinent labs & imaging results that were available during my care of the patient were reviewed by me and considered in my medical decision making (see chart for details).       Final Clinical Impressions(s) / ED Diagnoses   Final diagnoses:  Right ovarian cyst     New Prescriptions New Prescriptions   HYDROCODONE-ACETAMINOPHEN (NORCO/VICODIN) 5-325 MG TABLET    Take 2 tablets by mouth every 4 (four) hours as needed.    IBUPROFEN (ADVIL,MOTRIN) 800 MG TABLET    Take 1 tablet (800 mg total) by mouth 3 (three) times daily.     Lonia Skinner [place], PA-C 06/22/16 1324    Maia Plan, MD 06/22/16 [phone]

## 2016-06-22 NOTE — ED Triage Notes (Signed)
Pt states RLQ pain and low grade fever.  Hx of ovarian cysts (8 months post partum), so she called her obgyn and she told her to come here to r/o appendicitis.

## 2016-06-22 NOTE — Discharge Instructions (Signed)
Return if any problems.  See your GYN for evaluation

## 2017-10-07 IMAGING — CT CT ABD-PELV W/ CM
2 of 4 series · 16 of 46 positions shown, 18 images · IV contrast (Omni 300)
Comparison: None.

CLINICAL DATA: Right lower quadrant pain and fever

EXAM:
CT ABDOMEN AND PELVIS WITH CONTRAST
TECHNIQUE: Multidetector CT imaging of the abdomen and pelvis was performed
using the standard protocol following bolus administration of
intravenous contrast.
CONTRAST:  100mL 20YVIC-U11 IOPAMIDOL (20YVIC-U11) INJECTION 61%

[Series 2: a/p w/ 5mm · axial · 0.67mm/px · z∈[+820,+1220]mm · 13 of 88 slices shown, 15 images]
[im 4/88  soft-tissue]
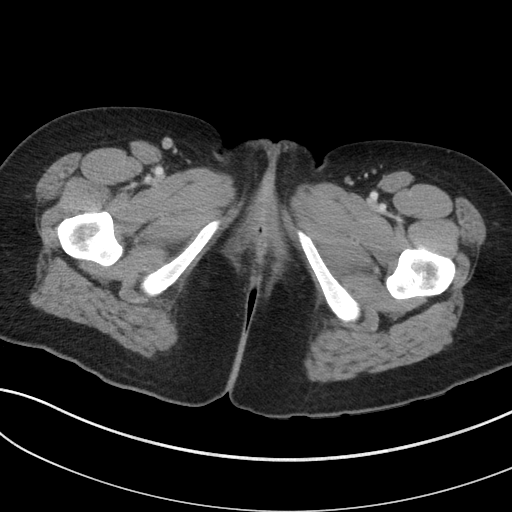
[im 4/88  bone]
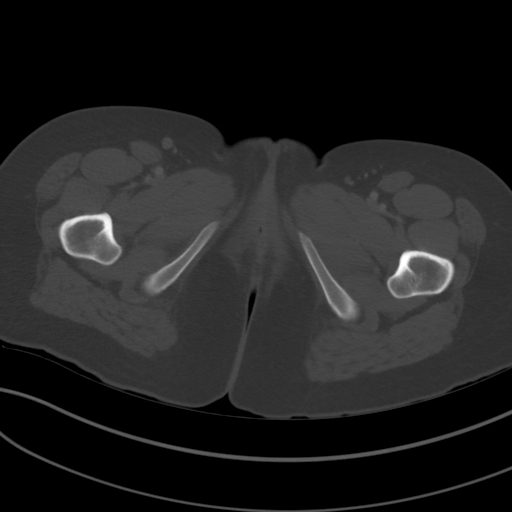
[im 11/88  soft-tissue]
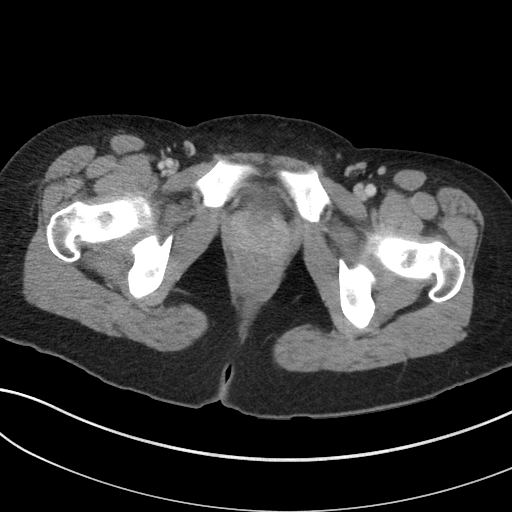
[im 18/88  soft-tissue]
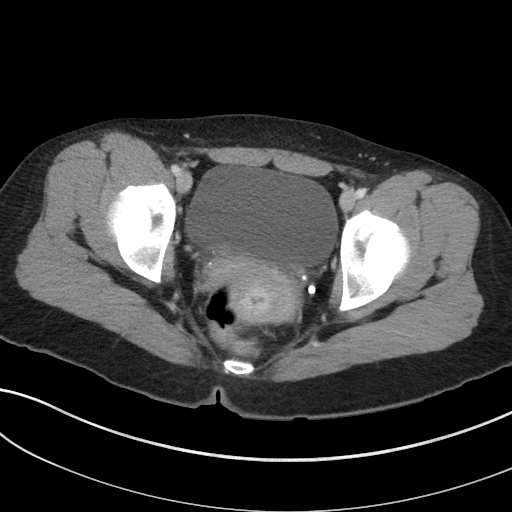
[im 25/88  soft-tissue]
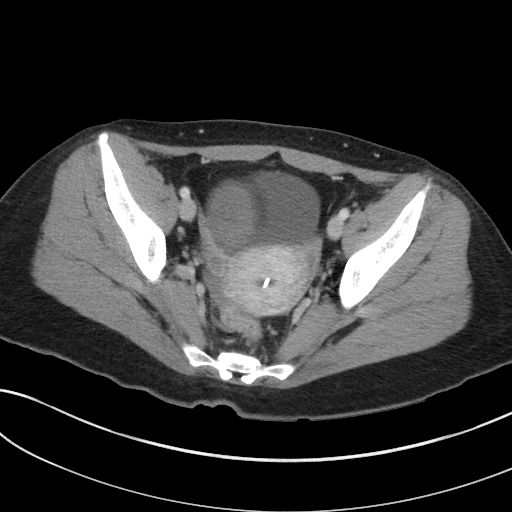
[im 32/88  soft-tissue]
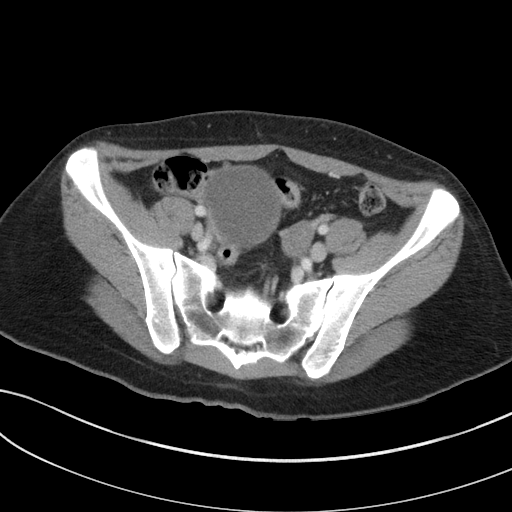
[im 39/88  soft-tissue]
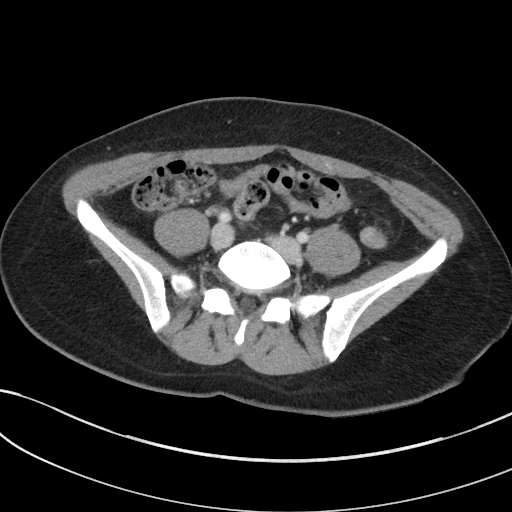
[im 46/88  soft-tissue]
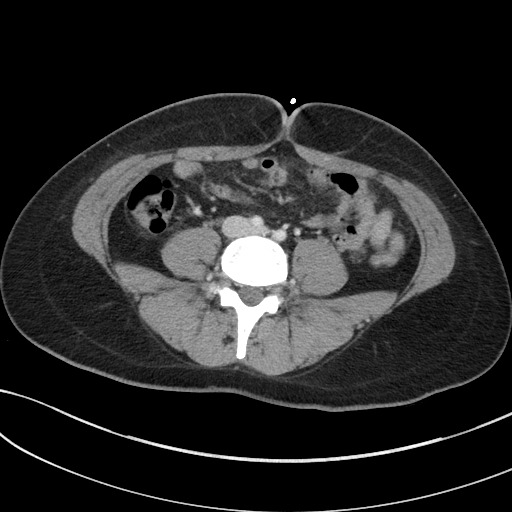
[im 49/88  soft-tissue]
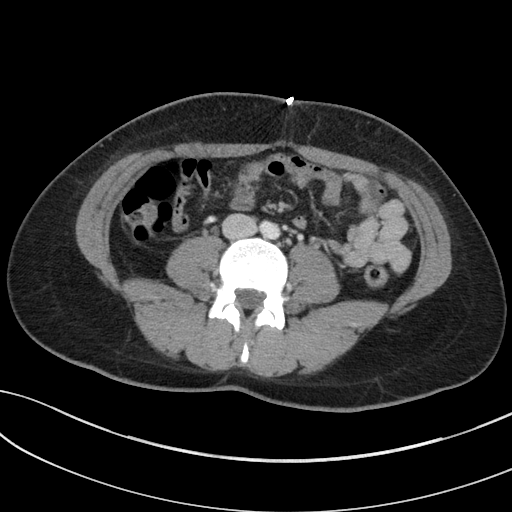
[im 56/88  soft-tissue]
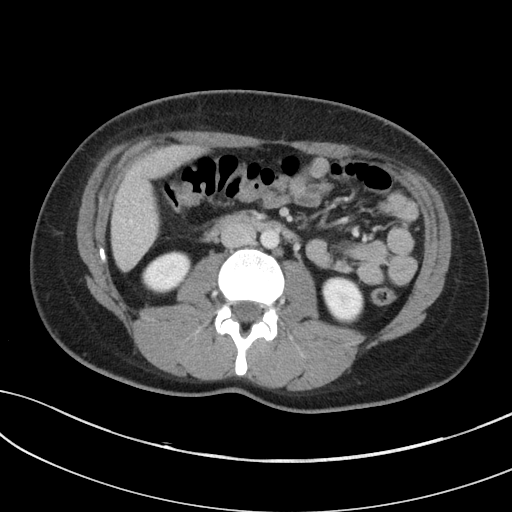
[im 56/88  bone]
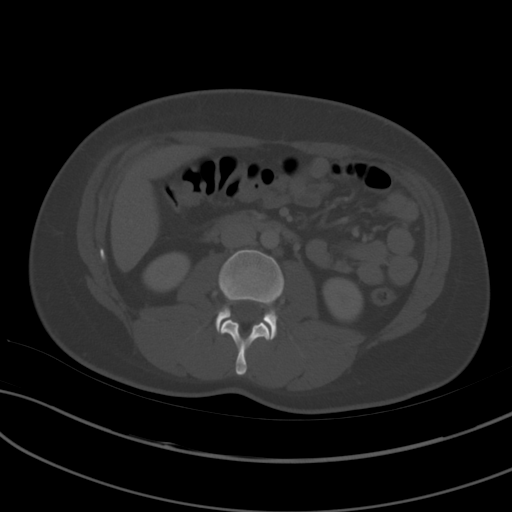
[im 63/88  soft-tissue]
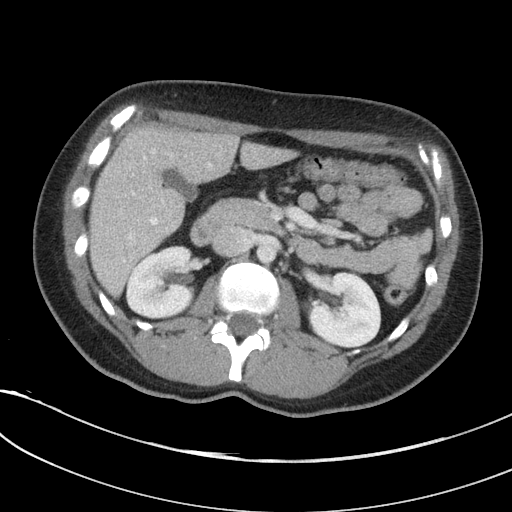
[im 70/88  soft-tissue]
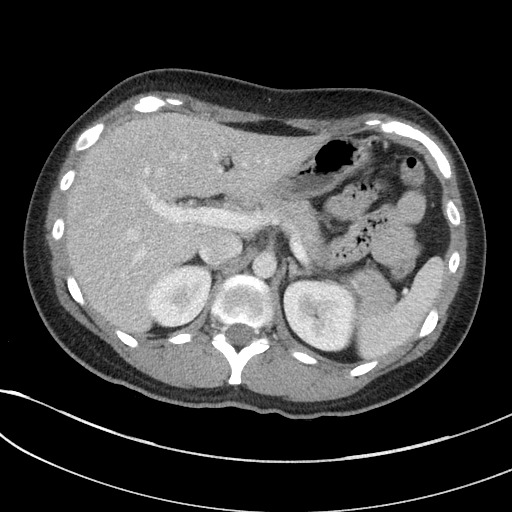
[im 77/88  soft-tissue]
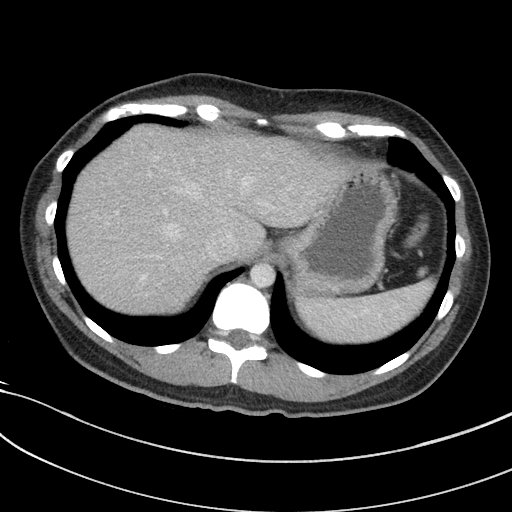
[im 84/88  soft-tissue]
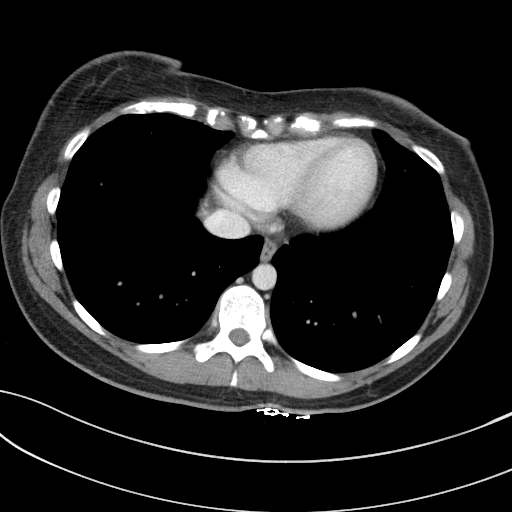

[Series 5: a/p w/ cor · coronal · 0.80mm/px · 3 of 131 slices shown]
[im 44/131  soft-tissue]
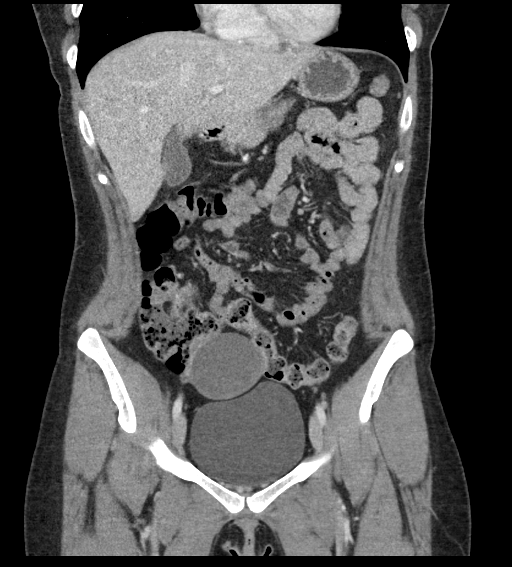
[im 58/131  soft-tissue]
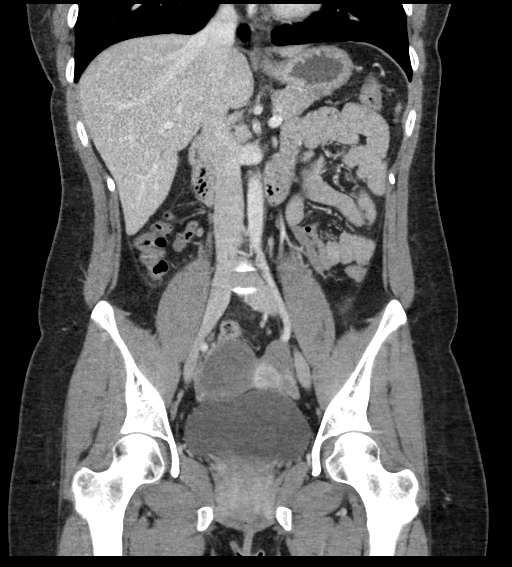
[im 73/131  soft-tissue]
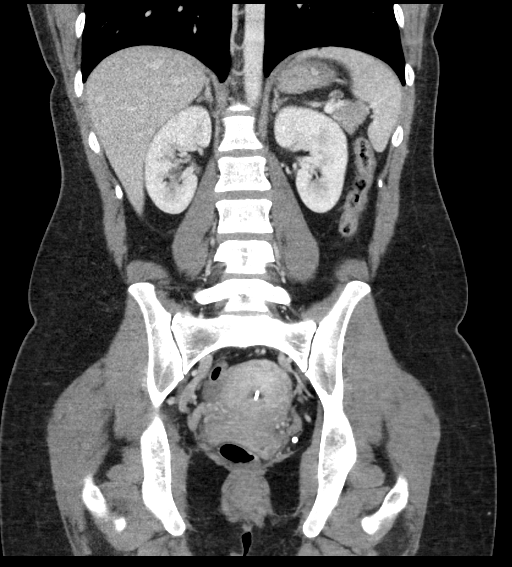

[16 of 46 positions shown; findings below may reference images not displayed]

FINDINGS: Lower chest: No acute abnormality.

Hepatobiliary: No focal liver abnormality is seen. No gallstones,
gallbladder wall thickening, or biliary dilatation.

Pancreas: Unremarkable. No pancreatic ductal dilatation or
surrounding inflammatory changes.

Spleen: Normal in size without focal abnormality.

Adrenals/Urinary Tract: Adrenal glands are unremarkable. Kidneys are
normal, without renal calculi, focal lesion, or hydronephrosis.
Bladder is unremarkable.

Stomach/Bowel: The appendix is not well visualized. No inflammatory
changes are identified to suggest appendicitis. No obstructive
changes are noted.

Vascular/Lymphatic: No significant vascular findings are present. No
enlarged abdominal or pelvic lymph nodes.

Reproductive: Uterus is within normal limits. An IUD is noted in
place. A large 6.1 cm cyst is noted arising from the right ovary and
likely contributing to the patient's discomfort.

Other: Mild free fluid is noted. This may be related to a ruptured
ovarian cyst

Musculoskeletal: No acute or significant osseous findings.
IMPRESSION: Nonvisualization of the appendix. No inflammatory changes are noted.

Large right ovarian cyst as well as mild free fluid within the
pelvic cul-de-sac. This could be related to recent cyst rupture.

No other focal abnormality is noted.

## 2021-04-14 ENCOUNTER — Telehealth: Payer: Self-pay | Admitting: Internal Medicine

## 2021-04-14 NOTE — Telephone Encounter (Signed)
Patient is looking for a primary care physician and you were recommended to her by [facility].  Would you be willing to see this patient to establish care?

## 2021-04-15 NOTE — Telephone Encounter (Signed)
Unfortunately, I'm not able to accept any more new patients at this time.  I'm sorry! Thank you!  

## 2021-04-15 NOTE — Telephone Encounter (Signed)
Patient informed. 

## 2021-12-04 ENCOUNTER — Encounter (HOSPITAL_BASED_OUTPATIENT_CLINIC_OR_DEPARTMENT_OTHER): Payer: Self-pay | Admitting: Obstetrics & Gynecology

## 2021-12-05 ENCOUNTER — Encounter (HOSPITAL_BASED_OUTPATIENT_CLINIC_OR_DEPARTMENT_OTHER): Payer: Self-pay | Admitting: Obstetrics & Gynecology

## 2021-12-05 ENCOUNTER — Other Ambulatory Visit: Payer: Self-pay

## 2021-12-05 NOTE — Progress Notes (Signed)
Spoke w/ via phone for pre-op interview---pt Lab needs dos---- cbc , t & s, urine preg              Lab results------ COVID test -----patient states asymptomatic no test needed Arrive at -------530 am 12-15-2021 NPO after MN NO Solid Food.  Clear liquids from MN until---430 am Med rec completed Medications to take morning of surgery -----ativan prn Diabetic medication -----n/a Patient instructed no nail polish to be worn day of surgery Patient instructed to bring photo id and insurance card day of surgery Patient aware to have Driver (ride ) / caregiver  Andrea [name] fiance   for 24 hours after surgery  Patient Special Instructions -----none Pre-Op special Istructions -----none Patient verbalized understanding of instructions that were given at this phone interview. Patient denies shortness of breath, chest pain, fever, cough at this phone interview.

## 2021-12-11 NOTE — H&P (Addendum)
Andrea [name] is an 31 y.o. female G1P1 with desire for permanent sterility.  Patient had SVD in 2017 and has consistently expressed desire to not have more children.  She has tried multiple types of contraception and has not found a method that does not cause intolerable SE.  She is here for BTL.  Pertinent Gynecological History: Menses: flow is moderate Bleeding: regular Contraception: abstinence DES exposure: unknown Blood transfusions: none Sexually transmitted diseases: no past history Previous GYN Procedures:  none   Last mammogram:  n/a  Date: n/a Last pap: abnormal: +HRHPV  Date: 05/2021 OB History: G1, P1   Menstrual History: Menarche age: n/a Patient's last menstrual period was 12/03/2021 (exact date).    Past Medical History:  Diagnosis Date   Anxiety    GERD (gastroesophageal reflux disease)    Headache    tension headache   history of Hypothyroidism    on medication 2013 to 2015, issue resolved   History of mononucleosis 2010   Hx of varicella    age 71   MTHFR mutation    (cannot process b vitamins per pt) takes vitamin b 12 methylated for   TMJ (temporomandibular joint syndrome)    botox injections every 3 months wears night guard   Wears glasses or contacts     Past Surgical History:  Procedure Laterality Date   WISDOM TOOTH EXTRACTION  2018    Family History  Problem Relation Age of Onset   Asthma Neg Hx    Arthritis Neg Hx    Alcohol abuse Neg Hx    Birth defects Neg Hx    Cancer Neg Hx    COPD Neg Hx    Depression Neg Hx    Diabetes Neg Hx    Drug abuse Neg Hx    Early death Neg Hx    Hearing loss Neg Hx    Heart disease Neg Hx    Hyperlipidemia Neg Hx    Hypertension Neg Hx    Kidney disease Neg Hx    Learning disabilities Neg Hx    Mental illness Neg Hx    Mental retardation Neg Hx    Miscarriages / Stillbirths Neg Hx    Stroke Neg Hx    Vision loss Neg Hx    Varicose Veins Neg Hx     Social History:  reports that she has  never smoked. She has never used smokeless tobacco. She reports current drug use. Drug: Marijuana. She reports that she does not drink alcohol.  Allergies:  Allergies  Allergen Reactions   Codeine Itching   Gluten Meal     Stomach bloating and diarrhea with gluten    No medications prior to admission.    Review of Systems  Height 5\' 2"  (1.575 m), weight 52.2 kg, last menstrual period 12/03/2021, unknown if currently breastfeeding. Physical Exam Constitutional:      Appearance: Normal appearance.  HENT:     Head: Normocephalic and atraumatic.  Pulmonary:     Effort: Pulmonary effort is normal.  Abdominal:     Palpations: Abdomen is soft.  Musculoskeletal:        General: Normal range of motion.     Cervical back: Normal range of motion.  Skin:    General: Skin is warm and dry.  Neurological:     Mental Status: She is alert and oriented to person, place, and time.  Psychiatric:        Mood and Affect: Mood normal.  Behavior: Behavior normal.     No results found for this or any previous visit (from the past 24 hour(s)).  No results found.  Assessment/Plan: 31yo G1P1 with desire for sterility -L/S BTL -Patient is counseled re: risk of bleeding, infection, scarring and damage to surrounding structures.  She is informed of steps of procedure as well as postop expectations.  She is informed of failure rate, increased risk of ectopic, need for contraception x 6 weeks postop, and risk of regret.  All questions were answered and patient wishes to proceed.  Mitchel Honour 12/11/2021, 8:49 AM

## 2021-12-12 NOTE — Anesthesia Preprocedure Evaluation (Addendum)
Anesthesia Evaluation  Patient identified by MRN, date of birth, ID band Patient awake    Reviewed: Allergy & Precautions, NPO status , Patient's Chart, lab work & pertinent test results  Airway Mallampati: I  TM Distance: >3 FB Neck ROM: Full    Dental no notable dental hx. (+) Dental Advisory Given, Teeth Intact   Pulmonary neg pulmonary ROS,    Pulmonary exam normal breath sounds clear to auscultation       Cardiovascular negative cardio ROS Normal cardiovascular exam Rhythm:Regular Rate:Normal     Neuro/Psych  Headaches, PSYCHIATRIC DISORDERS Anxiety    GI/Hepatic Neg liver ROS, GERD  ,  Endo/Other  Hypothyroidism   Renal/GU negative Renal ROS     Musculoskeletal negative musculoskeletal ROS (+)   Abdominal   Peds  Hematology negative hematology ROS (+)   Anesthesia Other Findings   Reproductive/Obstetrics                            Anesthesia Physical Anesthesia Plan  ASA: 2  Anesthesia Plan: General   Post-op Pain Management: Toradol IV (intra-op)* and Ofirmev IV (intra-op)*   Induction: Intravenous  PONV Risk Score and Plan: 4 or greater and Ondansetron, Dexamethasone, Treatment may vary due to age or medical condition, Midazolam and Scopolamine patch - Pre-op  Airway Management Planned: Oral ETT  Additional Equipment:   Intra-op Plan:   Post-operative Plan: Extubation in OR  Informed Consent: I have reviewed the patients History and Physical, chart, labs and discussed the procedure including the risks, benefits and alternatives for the proposed anesthesia with the patient or authorized representative who has indicated his/her understanding and acceptance.     Dental advisory given  Plan Discussed with: CRNA  Anesthesia Plan Comments:        Anesthesia Quick Evaluation

## 2021-12-15 ENCOUNTER — Encounter (HOSPITAL_BASED_OUTPATIENT_CLINIC_OR_DEPARTMENT_OTHER)
Admission: RE | Disposition: A | Discharge status: Home / Self Care | Payer: Self-pay | Source: Home / Self Care | Attending: Obstetrics & Gynecology

## 2021-12-15 ENCOUNTER — Encounter (HOSPITAL_BASED_OUTPATIENT_CLINIC_OR_DEPARTMENT_OTHER): Payer: Self-pay | Admitting: Obstetrics & Gynecology

## 2021-12-15 ENCOUNTER — Other Ambulatory Visit: Payer: Self-pay

## 2021-12-15 ENCOUNTER — Ambulatory Visit (HOSPITAL_BASED_OUTPATIENT_CLINIC_OR_DEPARTMENT_OTHER): Payer: Medicaid Other | Admitting: Anesthesiology

## 2021-12-15 ENCOUNTER — Ambulatory Visit (HOSPITAL_BASED_OUTPATIENT_CLINIC_OR_DEPARTMENT_OTHER)
Admission: RE | Admit: 2021-12-15 | Discharge: 2021-12-15 | Disposition: A | Discharge status: Home / Self Care | Payer: Medicaid Other | Attending: Obstetrics & Gynecology | Admitting: Obstetrics & Gynecology

## 2021-12-15 DIAGNOSIS — N979 Female infertility, unspecified: Secondary | ICD-10-CM

## 2021-12-15 DIAGNOSIS — F1290 Cannabis use, unspecified, uncomplicated: Secondary | ICD-10-CM | POA: Diagnosis not present

## 2021-12-15 DIAGNOSIS — Z302 Encounter for sterilization: Secondary | ICD-10-CM | POA: Insufficient documentation

## 2021-12-15 DIAGNOSIS — Z01818 Encounter for other preprocedural examination: Secondary | ICD-10-CM

## 2021-12-15 DIAGNOSIS — K219 Gastro-esophageal reflux disease without esophagitis: Secondary | ICD-10-CM | POA: Diagnosis not present

## 2021-12-15 DIAGNOSIS — E039 Hypothyroidism, unspecified: Secondary | ICD-10-CM | POA: Insufficient documentation

## 2021-12-15 HISTORY — DX: Unspecified temporomandibular joint disorder, unspecified side: M26.609

## 2021-12-15 HISTORY — DX: Presence of spectacles and contact lenses: Z97.3

## 2021-12-15 HISTORY — PX: LAPAROSCOPIC TUBAL LIGATION: SHX1937

## 2021-12-15 HISTORY — DX: Gastro-esophageal reflux disease without esophagitis: K21.9

## 2021-12-15 LAB — POCT PREGNANCY, URINE: Preg Test, Ur: NEGATIVE

## 2021-12-15 LAB — TYPE AND SCREEN
ABO/RH(D): A POS
Antibody Screen: NEGATIVE

## 2021-12-15 LAB — CBC
HCT: 35.7 % — ABNORMAL LOW (ref 36.0–46.0)
Hemoglobin: 12.2 g/dL (ref 12.0–15.0)
MCH: 31.3 pg (ref 26.0–34.0)
MCHC: 34.2 g/dL (ref 30.0–36.0)
MCV: 91.5 fL (ref 80.0–100.0)
Platelets: 253 10*3/uL (ref 150–400)
RBC: 3.9 MIL/uL (ref 3.87–5.11)
RDW: 13.2 % (ref 11.5–15.5)
WBC: 5.8 10*3/uL (ref 4.0–10.5)
nRBC: 0 % (ref 0.0–0.2)

## 2021-12-15 SURGERY — LIGATION, FALLOPIAN TUBE, LAPAROSCOPIC
Anesthesia: General | Site: Abdomen | Laterality: Bilateral

## 2021-12-15 MED ORDER — OXYCODONE HCL 5 MG/5ML PO SOLN: 5.0000 mg | Freq: Once | ORAL | Status: DC | PRN

## 2021-12-15 MED ORDER — KETOROLAC TROMETHAMINE 30 MG/ML IJ SOLN
INTRAMUSCULAR | Status: DC | PRN
  Administered 2021-12-15: 30 mg via INTRAVENOUS

## 2021-12-15 MED ORDER — SUGAMMADEX SODIUM 200 MG/2ML IV SOLN
INTRAVENOUS | Status: DC | PRN
  Administered 2021-12-15: 200 mg via INTRAVENOUS

## 2021-12-15 MED ORDER — LIDOCAINE HCL (CARDIAC) PF 100 MG/5ML IV SOSY
PREFILLED_SYRINGE | INTRAVENOUS | Status: DC | PRN
  Administered 2021-12-15: 50 mg via INTRAVENOUS

## 2021-12-15 MED ORDER — MEPERIDINE HCL 25 MG/ML IJ SOLN: 6.2500 mg | INTRAMUSCULAR | Status: DC | PRN

## 2021-12-15 MED ORDER — ONDANSETRON HCL 4 MG/2ML IJ SOLN
INTRAMUSCULAR | Status: DC | PRN
  Administered 2021-12-15: 4 mg via INTRAVENOUS

## 2021-12-15 MED ORDER — LIDOCAINE HCL (PF) 2 % IJ SOLN
INTRAMUSCULAR | Status: AC
  Filled 2021-12-15: qty 5

## 2021-12-15 MED ORDER — 0.9 % SODIUM CHLORIDE (POUR BTL) OPTIME
TOPICAL | Status: DC | PRN
  Administered 2021-12-15: 500 mL

## 2021-12-15 MED ORDER — PROMETHAZINE HCL 25 MG/ML IJ SOLN: 6.2500 mg | INTRAMUSCULAR | Status: DC | PRN

## 2021-12-15 MED ORDER — FENTANYL CITRATE (PF) 100 MCG/2ML IJ SOLN
INTRAMUSCULAR | Status: DC | PRN
  Administered 2021-12-15 (×4): 50 ug via INTRAVENOUS

## 2021-12-15 MED ORDER — AMISULPRIDE (ANTIEMETIC) 5 MG/2ML IV SOLN
10.0000 mg | Freq: Once | INTRAVENOUS | Status: DC | PRN
Start: 2021-12-15 — End: 2021-12-15

## 2021-12-15 MED ORDER — FENTANYL CITRATE (PF) 100 MCG/2ML IJ SOLN
INTRAMUSCULAR | Status: AC
  Filled 2021-12-15: qty 2

## 2021-12-15 MED ORDER — MIDAZOLAM HCL 2 MG/2ML IJ SOLN
INTRAMUSCULAR | Status: AC
  Filled 2021-12-15: qty 2

## 2021-12-15 MED ORDER — DEXAMETHASONE SODIUM PHOSPHATE 10 MG/ML IJ SOLN
INTRAMUSCULAR | Status: AC
  Filled 2021-12-15: qty 1

## 2021-12-15 MED ORDER — MIDAZOLAM HCL 5 MG/5ML IJ SOLN
INTRAMUSCULAR | Status: DC | PRN
  Administered 2021-12-15: 2 mg via INTRAVENOUS

## 2021-12-15 MED ORDER — BUPIVACAINE HCL (PF) 0.25 % IJ SOLN
INTRAMUSCULAR | Status: DC | PRN
  Administered 2021-12-15: 4 mL

## 2021-12-15 MED ORDER — ONDANSETRON HCL 4 MG/2ML IJ SOLN
INTRAMUSCULAR | Status: AC
  Filled 2021-12-15: qty 2

## 2021-12-15 MED ORDER — OXYCODONE HCL 5 MG PO TABS: 5.0000 mg | ORAL_TABLET | Freq: Once | ORAL | Status: DC | PRN

## 2021-12-15 MED ORDER — SCOPOLAMINE 1 MG/3DAYS TD PT72
MEDICATED_PATCH | TRANSDERMAL | Status: AC
  Filled 2021-12-15: qty 1

## 2021-12-15 MED ORDER — ROCURONIUM BROMIDE 10 MG/ML (PF) SYRINGE
PREFILLED_SYRINGE | INTRAVENOUS | Status: AC
  Filled 2021-12-15: qty 10

## 2021-12-15 MED ORDER — LACTATED RINGERS IV SOLN: INTRAVENOUS | Status: DC

## 2021-12-15 MED ORDER — OXYCODONE-ACETAMINOPHEN 5-325 MG PO TABS
1.0000 | ORAL_TABLET | ORAL | 0 refills | Status: AC | PRN
Start: 2021-12-15 — End: ?

## 2021-12-15 MED ORDER — PROPOFOL 10 MG/ML IV BOLUS
INTRAVENOUS | Status: AC
  Filled 2021-12-15: qty 20

## 2021-12-15 MED ORDER — DEXAMETHASONE SODIUM PHOSPHATE 4 MG/ML IJ SOLN
INTRAMUSCULAR | Status: DC | PRN
  Administered 2021-12-15: 5 mg via INTRAVENOUS

## 2021-12-15 MED ORDER — IBUPROFEN 600 MG PO TABS: 600.0000 mg | ORAL_TABLET | Freq: Four times a day (QID) | ORAL | 0 refills | Status: AC | PRN

## 2021-12-15 MED ORDER — HYDROMORPHONE HCL 1 MG/ML IJ SOLN: 0.2500 mg | INTRAMUSCULAR | Status: DC | PRN

## 2021-12-15 MED ORDER — SCOPOLAMINE 1 MG/3DAYS TD PT72
1.0000 | MEDICATED_PATCH | TRANSDERMAL | Status: DC
  Administered 2021-12-15: 1.5 mg via TRANSDERMAL

## 2021-12-15 MED ORDER — POVIDONE-IODINE 10 % EX SWAB: 2.0000 | Freq: Once | CUTANEOUS | Status: DC

## 2021-12-15 MED ORDER — ROCURONIUM BROMIDE 100 MG/10ML IV SOLN
INTRAVENOUS | Status: DC | PRN
  Administered 2021-12-15: 40 mg via INTRAVENOUS

## 2021-12-15 MED ORDER — PROPOFOL 10 MG/ML IV BOLUS
INTRAVENOUS | Status: DC | PRN
  Administered 2021-12-15: 130 mg via INTRAVENOUS

## 2021-12-15 SURGICAL SUPPLY — 45 items
ADH SKN CLS APL DERMABOND .7 (GAUZE/BANDAGES/DRESSINGS) ×1
APL SKNCLS STERI-STRIP NONHPOA (GAUZE/BANDAGES/DRESSINGS)
BENZOIN TINCTURE PRP APPL 2/3 (GAUZE/BANDAGES/DRESSINGS) IMPLANT
CATH ROBINSON RED A/P 16FR (CATHETERS) ×1 IMPLANT
COVER MAYO STAND STRL (DRAPES) ×1 IMPLANT
DERMABOND ADVANCED (GAUZE/BANDAGES/DRESSINGS) ×1
DERMABOND ADVANCED .7 DNX12 (GAUZE/BANDAGES/DRESSINGS) IMPLANT
DRSG COVADERM PLUS 2X2 (GAUZE/BANDAGES/DRESSINGS) IMPLANT
DRSG OPSITE POSTOP 3X4 (GAUZE/BANDAGES/DRESSINGS) IMPLANT
DRSG TEGADERM 4X4.75 (GAUZE/BANDAGES/DRESSINGS) IMPLANT
DURAPREP 26ML APPLICATOR (WOUND CARE) ×1 IMPLANT
FORCEPS CUTTING 45CM 5MM (CUTTING FORCEPS) IMPLANT
GAUZE 4X4 16PLY ~~LOC~~+RFID DBL (SPONGE) ×2 IMPLANT
GLOVE BIO SURGEON STRL SZ 6 (GLOVE) ×1 IMPLANT
GLOVE BIOGEL PI IND STRL 6 (GLOVE) ×2 IMPLANT
GLOVE BIOGEL PI INDICATOR 6 (GLOVE) ×2
GLOVE ECLIPSE 6.0 STRL STRAW (GLOVE) ×1 IMPLANT
GLOVE SS PI  5.5 STRL (GLOVE) ×1
GLOVE SS PI 5.5 STRL (GLOVE) ×1 IMPLANT
GOWN STRL REUS W/TWL LRG LVL3 (GOWN DISPOSABLE) ×3 IMPLANT
KIT TURNOVER CYSTO (KITS) ×1 IMPLANT
NDL INSUFFLATION 14GA 120MM (NEEDLE) ×1 IMPLANT
NEEDLE INSUFFLATION 14GA 120MM (NEEDLE) ×1 IMPLANT
NS IRRIG 500ML POUR BTL (IV SOLUTION) ×1 IMPLANT
PACK LAPAROSCOPY BASIN (CUSTOM PROCEDURE TRAY) ×1 IMPLANT
PACK TRENDGUARD 450 HYBRID PRO (MISCELLANEOUS) IMPLANT
PAD OB MATERNITY 4.3X12.25 (PERSONAL CARE ITEMS) ×1 IMPLANT
PENCIL SMOKE EVACUATOR (MISCELLANEOUS) IMPLANT
SCISSORS LAP 5X35 DISP (ENDOMECHANICALS) IMPLANT
SET SUCTION IRRIG HYDROSURG (IRRIGATION / IRRIGATOR) IMPLANT
SET TUBE SMOKE EVAC HIGH FLOW (TUBING) ×1 IMPLANT
SOL PREP POV-IOD 4OZ 10% (MISCELLANEOUS) IMPLANT
SOLUTION ELECTROLUBE (MISCELLANEOUS) IMPLANT
SPONGE GAUZE 2X2 8PLY STRL LF (GAUZE/BANDAGES/DRESSINGS) IMPLANT
STRIP CLOSURE SKIN 1/2X4 (GAUZE/BANDAGES/DRESSINGS) IMPLANT
STRIP CLOSURE SKIN 1/4X4 (GAUZE/BANDAGES/DRESSINGS) IMPLANT
SUT MNCRL AB 3-0 PS2 18 (SUTURE) ×1 IMPLANT
SUT VICRYL 0 UR6 27IN ABS (SUTURE) ×1 IMPLANT
TOWEL OR 17X26 10 PK STRL BLUE (TOWEL DISPOSABLE) ×1 IMPLANT
TRENDGUARD 450 HYBRID PRO PACK (MISCELLANEOUS) ×1
TROCAR Z-THREAD FIOS 11X100 BL (TROCAR) ×1 IMPLANT
TROCAR Z-THREAD FIOS 5X100MM (TROCAR) IMPLANT
TUBE CONNECTING 12X1/4 (SUCTIONS) IMPLANT
WARMER LAPAROSCOPE (MISCELLANEOUS) ×1 IMPLANT
WATER STERILE IRR 500ML POUR (IV SOLUTION) ×1 IMPLANT

## 2021-12-15 NOTE — Op Note (Signed)
PREOPERATIVE DIAGNOSES: 1. Desires permanent sterilization   POSTOPERATIVE DIAGNOSES: 1. Same   PROCEDURE PERFORMED: Bilateral tubal ligation via cautery   SURGEON: Dr. Mitchel Honour ASSISTANT: None   ANESTHESIA: General    ESTIMATED BLOOD LOSS: 10cc.   COMPLICATIONS: None   FINDINGS: On exam, under anesthesia, normal appearing vulva and vagina.  Normal uterus, fallopian tubes and ovaries bilaterally.     Procedure: A general anesthesia was induced and the patient was placed in the dirsal lithotomy position. The abdomen, perineum, and vagina were prepped and draped in the usual fashion. The bladder was drained. After the initial preparation, the procedure commenced at the vagina. With a speculum in place to visualize the cervix, the cervix was grasped and acorn manipulator was placed within the uterine cavity for manipulation purposes being careful not to puncture the uterus. Attention was then turned to the abdomen.    An infraumbilical incision was made and the Veress needle was gently advanced taking care to feel for the typical sensation of penetrating the peritoneum. With CO2 infiltration, an opening pressure of was noted, and following this, a pneumoperitoneum of 15 mmHg was created. A 11 mm trocar was then passed through the same incision and the laparoscope was then inserted through the trocar sleeve.  Visualization of the peritoneal cavity was then obtained and inspection did not reveal any signs of complications from entry. Once the placement of the port was complete, the actual laparoscopic procedure began. Above operative findings noted.  A 5 mm skin incision was made in the suprapubic region.  5 mm trocar was advanced under direct visualization.  Once confirmation of right fallopian tube done an atraumatic grasper was used to elevate, the Klepinger was used to cauterize 3-4 cm of the isthmic portion of the fallopian tube.  The tube was thoroughly inspected and the tubal  segment was thoroughly coagulated. The same thing was done on the left fallopian tube. Hemostasis was noted and a complete inspection confirmed appropriate findings.    All gas removed from abdomen and all instruments were removed. Fascia was closed with 0' vicryl and skin closed with 4'0 monocryl. The sponge and lap counts were correct times 2 at this time.  Uterine manipulator was removed.    The patient's procedure was terminated. We then awakened her. She was sent to the Recovery Room in good condition.

## 2021-12-15 NOTE — Transfer of Care (Signed)
Immediate Anesthesia Transfer of Care Note  Patient: Andrea [name]  Procedure(s) Performed: Procedure(s) (LRB): LAPAROSCOPIC BILATERAL TUBAL LIGATION WITH CAUTERY (Bilateral)  Patient Location: PACU  Anesthesia Type: General  Level of Consciousness: awake, sedated, patient cooperative and responds to stimulation  Airway & Oxygen Therapy: Patient Spontanous Breathing and Patient connected to  oxygen  Post-op Assessment: Report given to PACU RN, Post -op Vital signs reviewed and stable and Patient moving all extremities  Post vital signs: Reviewed and stable  Complications: No apparent anesthesia complications

## 2021-12-15 NOTE — Progress Notes (Signed)
No change to H&P.  Taren Dymek, DO 

## 2021-12-15 NOTE — Anesthesia Procedure Notes (Signed)
Procedure Name: Intubation Date/Time: 12/15/2021 7:40 AM  Performed by: Justice Rocher, CRNAPre-anesthesia Checklist: Patient identified, Emergency Drugs available, Suction available, Patient being monitored and Timeout performed Patient Re-evaluated:Patient Re-evaluated prior to induction Oxygen Delivery Method: Circle system utilized Preoxygenation: Pre-oxygenation with 100% oxygen Induction Type: IV induction Ventilation: Mask ventilation without difficulty Laryngoscope Size: Mac and 3 Grade View: Grade II Tube type: Oral Tube size: 7.0 mm Number of attempts: 1 Airway Equipment and Method: Stylet and Oral airway Placement Confirmation: ETT inserted through vocal cords under direct vision, positive ETCO2 and breath sounds checked- equal and bilateral Secured at: 22 cm Tube secured with: Tape Dental Injury: Teeth and Oropharynx as per pre-operative assessment

## 2021-12-15 NOTE — Discharge Instructions (Addendum)
Call MD for T>100.4, heavy vaginal bleeding, severe abdominal pain, or respiratory distress.  Call office to schedule postop appointment in 2 weeks.  Pelvic rest x 4 weeks.  Back up contraception x 6 weeks.  No driving while taking narcotics.     Post Anesthesia Home Care Instructions  Activity: Get plenty of rest for the remainder of the day. A responsible individual must stay with you for 24 hours following the procedure.  For the next 24 hours, DO NOT: -Drive a car -Advertising copywriter -Drink alcoholic beverages -Take any medication unless instructed by your physician -Make any legal decisions or sign important papers.  Meals: Start with liquid foods such as gelatin or soup. Progress to regular foods as tolerated. Avoid greasy, spicy, heavy foods. If nausea and/or vomiting occur, drink only clear liquids until the nausea and/or vomiting subsides. Call your physician if vomiting continues.  Special Instructions/Symptoms: Your throat may feel dry or sore from the anesthesia or the breathing tube placed in your throat during surgery. If this causes discomfort, gargle with warm salt water. The discomfort should disappear within 24 hours.  If you had a scopolamine patch placed behind your ear for the management of post- operative nausea and/or vomiting:  1. The medication in the patch is effective for 72 hours, after which it should be removed.  Wrap patch in a tissue and discard in the trash. Wash hands thoroughly with soap and water. 2. You may remove the patch earlier than 72 hours if you experience unpleasant side effects which may include dry mouth, dizziness or visual disturbances. 3. Avoid touching the patch. Wash your hands with soap and water after contact with the patch.

## 2021-12-15 NOTE — Anesthesia Postprocedure Evaluation (Signed)
Anesthesia Post Note  Patient: Andrea [name]  Procedure(s) Performed: LAPAROSCOPIC BILATERAL TUBAL LIGATION WITH CAUTERY (Bilateral: Abdomen)     Patient location during evaluation: PACU Anesthesia Type: General Level of consciousness: sedated and patient cooperative Pain management: pain level controlled Vital Signs Assessment: post-procedure vital signs reviewed and stable Respiratory status: spontaneous breathing Cardiovascular status: stable Anesthetic complications: no   No notable events documented.  Last Vitals:  Vitals:   12/15/21 0930 12/15/21 1000  BP: 122/75 126/82  Pulse: 69 76  Resp: 20 16  Temp: (!) 36.1 C 36.6 C  SpO2: 99% 100%    Last Pain:  Vitals:   12/15/21 1000  TempSrc:   PainSc: 0-No pain                 [name]

## 2021-12-16 ENCOUNTER — Encounter (HOSPITAL_BASED_OUTPATIENT_CLINIC_OR_DEPARTMENT_OTHER): Payer: Self-pay | Admitting: Obstetrics & Gynecology
